# Patient Record
Sex: Female | Born: 1985 | Race: White | Hispanic: No | Smoking: Never smoker
Health system: Southern US, Community
[De-identification: ages and names within clinical notes are randomized; demographics above are authoritative.]

## PROBLEM LIST (undated history)

## (undated) DIAGNOSIS — Z98891 History of uterine scar from previous surgery: Secondary | ICD-10-CM

## (undated) DIAGNOSIS — K831 Obstruction of bile duct: Secondary | ICD-10-CM

## (undated) DIAGNOSIS — J40 Bronchitis, not specified as acute or chronic: Secondary | ICD-10-CM

## (undated) DIAGNOSIS — IMO0002 Reserved for concepts with insufficient information to code with codable children: Secondary | ICD-10-CM

## (undated) DIAGNOSIS — M797 Fibromyalgia: Secondary | ICD-10-CM

## (undated) HISTORY — PX: WISDOM TOOTH EXTRACTION: SHX21

## (undated) HISTORY — PX: KNEE SURGERY: SHX244

## (undated) SURGERY — Surgical Case
Anesthesia: *Unknown

---

## 2007-04-06 ENCOUNTER — Encounter: Admission: RE | Admit: 2007-04-06 | Discharge: 2007-04-06 | Payer: Self-pay | Admitting: Family Medicine

## 2008-01-30 IMAGING — US US SOFT TISSUE HEAD/NECK
1 series · 14 of 25 positions shown · non-contrast
Comparison: None.

CLINICAL DATA: Enlarged thyroid gland on physical examination.

THYROID ULTRASOUND
TECHNIQUE: Ultrasound examination of the thyroid gland and adjacent soft tissue
structures was performed.

[Series 1: unknown · 0.07mm/px · 14 of 30 slices shown]
[im 1/30]
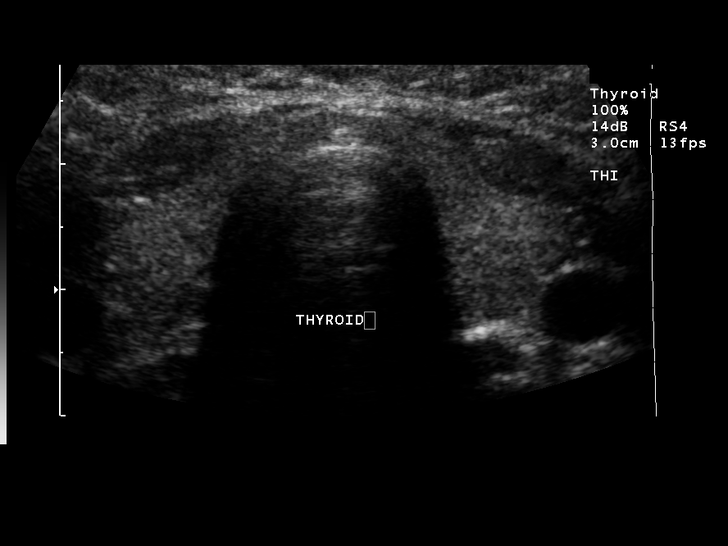
[im 3/30]
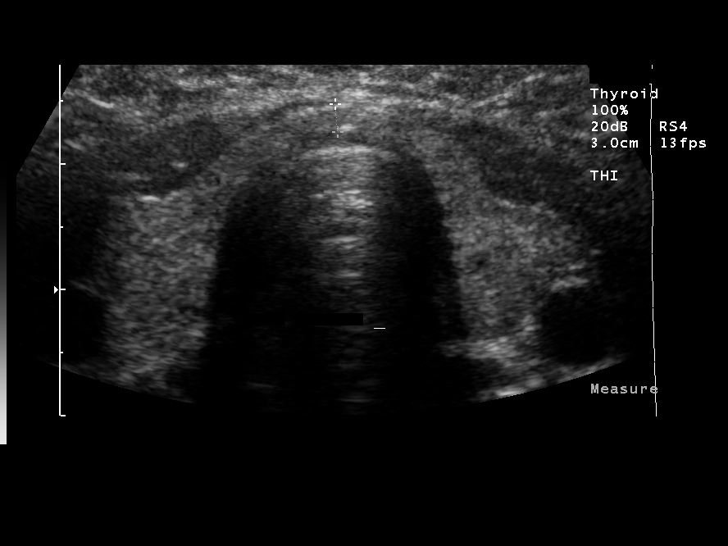
[im 5/30]
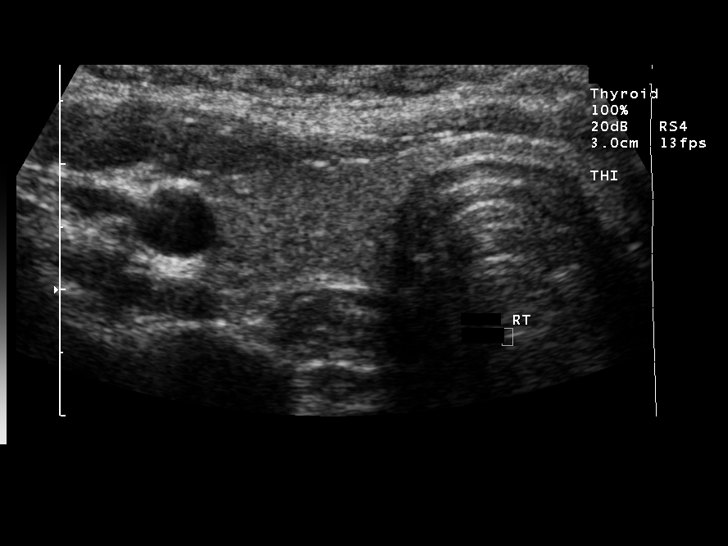
[im 8/30]
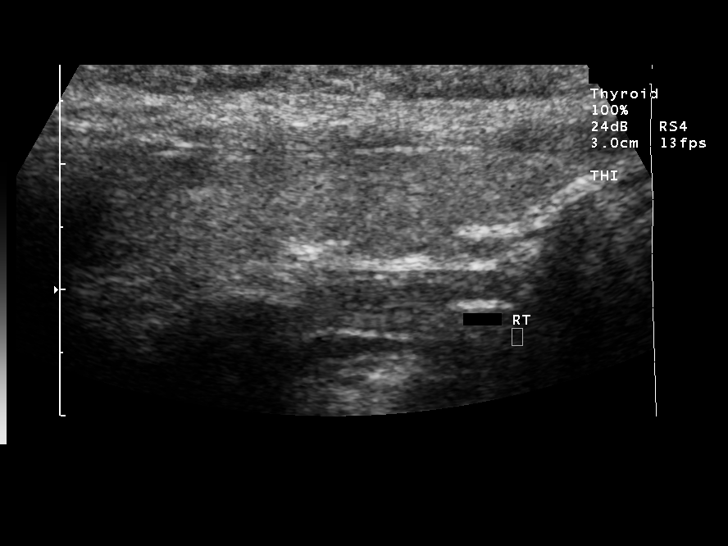
[im 10/30]
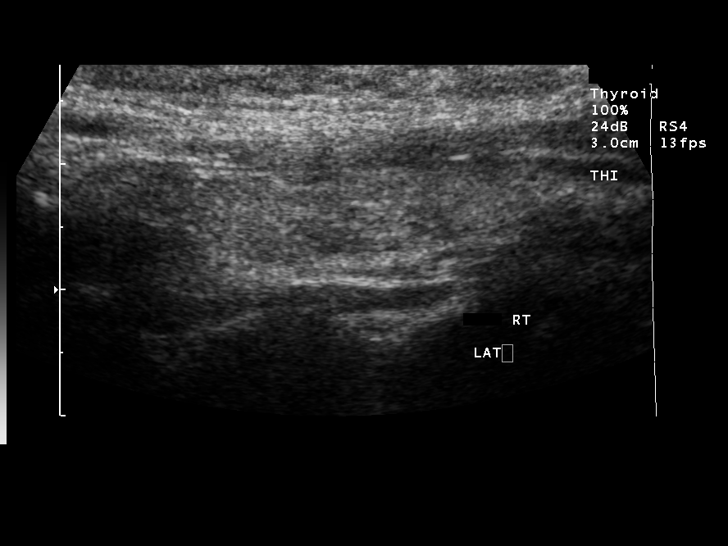
[im 11/30]
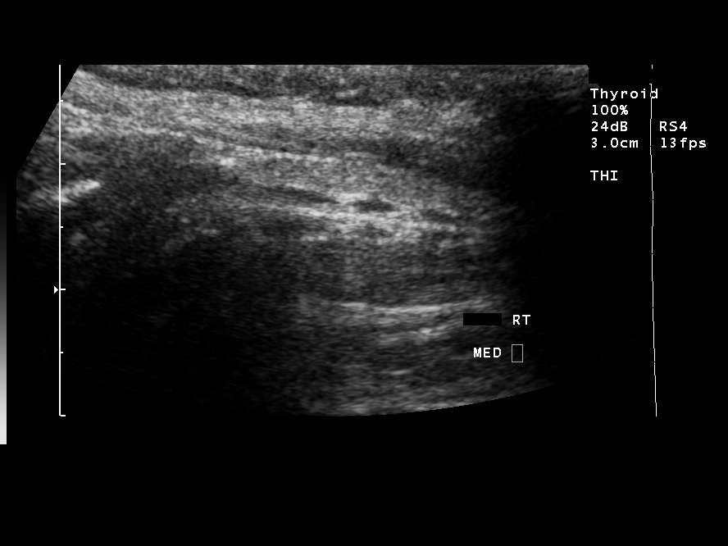
[im 14/30]
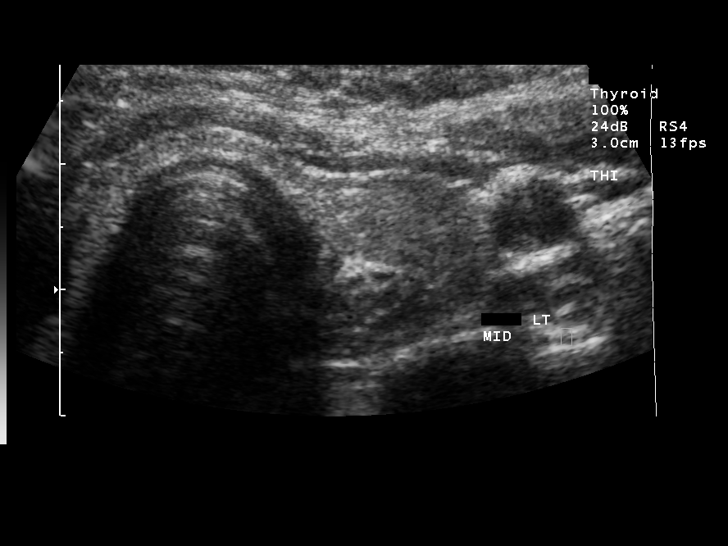
[im 16/30]
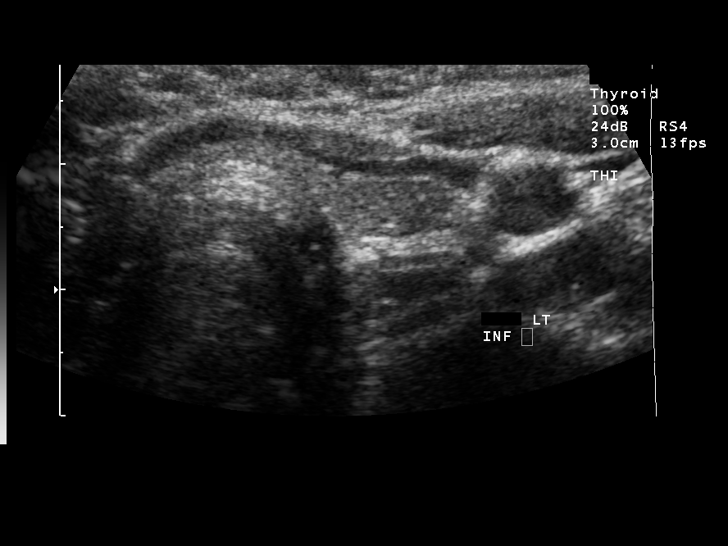
[im 19/30]
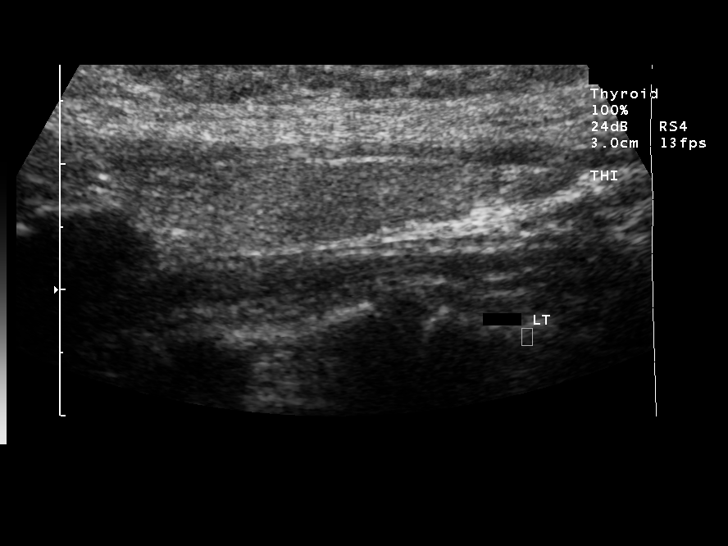
[im 20/30]
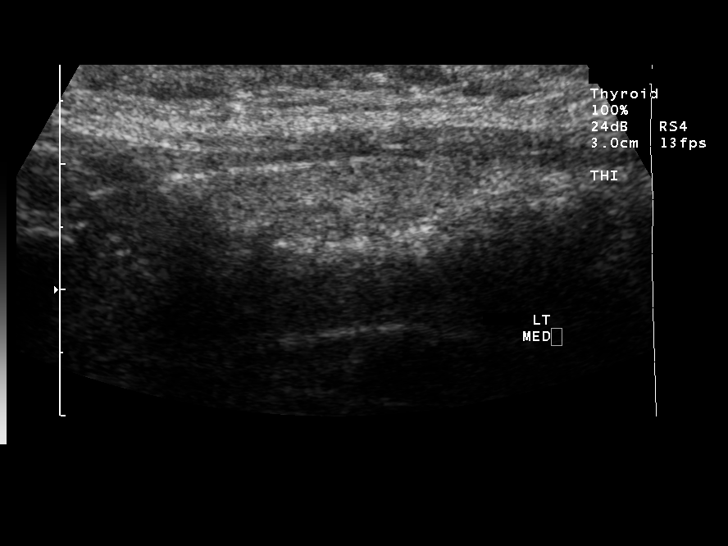
[im 22/30]
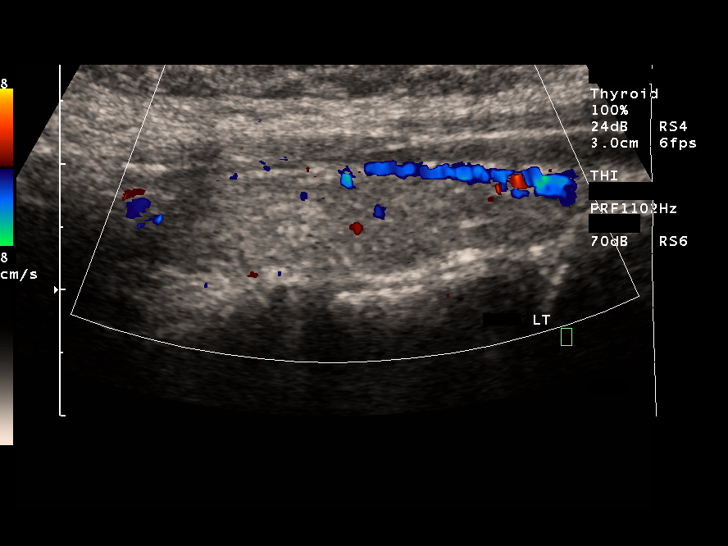
[im 25/30]
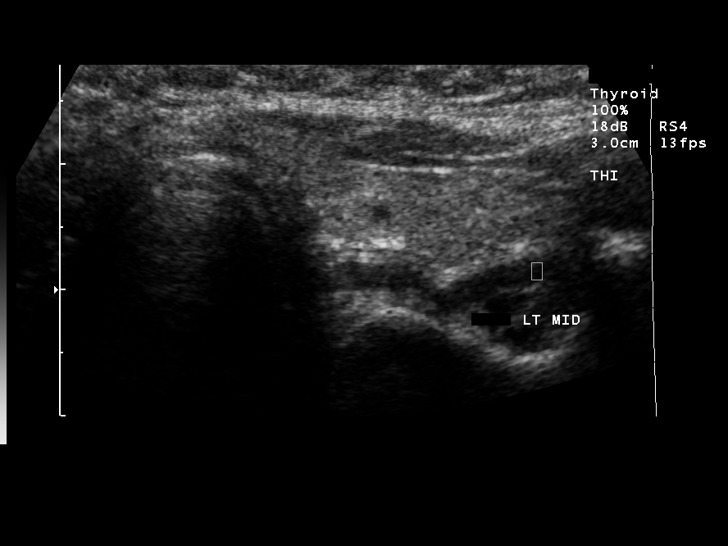
[im 27/30]
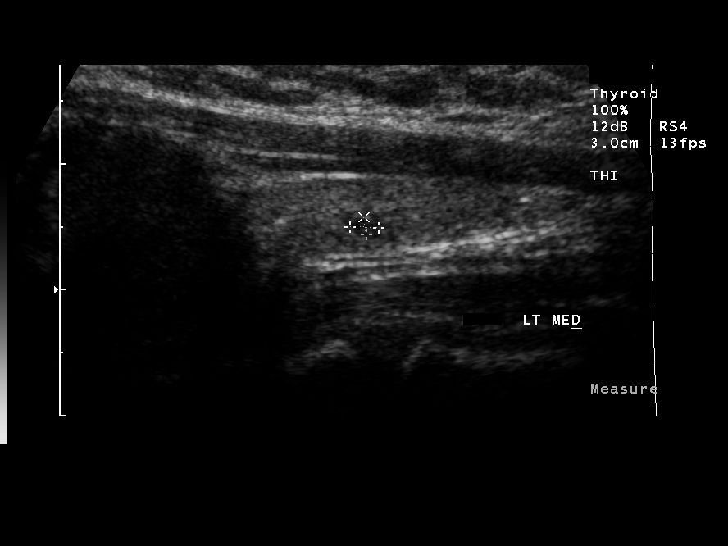
[im 30/30]
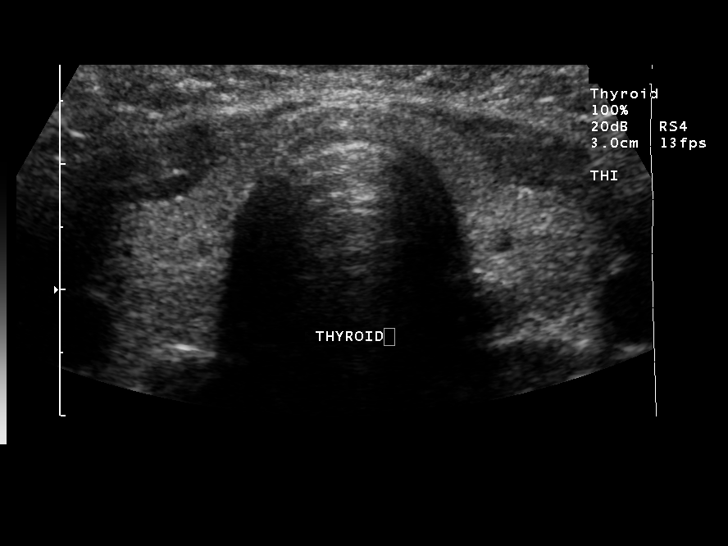

[14 of 25 positions shown; findings below may reference images not displayed]

FINDINGS: The thyroid gland is normal in size, shape and echotexture. A tube 2 x
1 mm cyst is noted in the medial aspect of the midportion of the left lobe. No
other abnormalities are seen.

The right lobe measures 4.3 x 1.8 x 0.9 cm in maximum dimensions. The left lobe
measures 3.6 x 1.6 x 0.6 cm in maximum dimensions. The isthmus measures 2.2 mm
in thickness in the midline.

IMPRESSION

2 mm left lobe and thyroid cyst. Otherwise, normal examination.

## 2011-08-23 ENCOUNTER — Emergency Department (HOSPITAL_COMMUNITY)
Admission: EM | Admit: 2011-08-23 | Discharge: 2011-08-23 | Disposition: A | Discharge status: Home / Self Care | Payer: 59 | Source: Home / Self Care | Attending: Emergency Medicine | Admitting: Emergency Medicine

## 2011-08-23 ENCOUNTER — Encounter: Payer: Self-pay | Admitting: *Deleted

## 2011-08-23 DIAGNOSIS — J40 Bronchitis, not specified as acute or chronic: Secondary | ICD-10-CM

## 2011-08-23 HISTORY — DX: Bronchitis, not specified as acute or chronic: J40

## 2011-08-23 MED ORDER — ALBUTEROL SULFATE HFA 108 (90 BASE) MCG/ACT IN AERS: 1.0000 | INHALATION_SPRAY | Freq: Four times a day (QID) | RESPIRATORY_TRACT | Status: DC | PRN

## 2011-08-23 MED ORDER — PSEUDOEPHEDRINE-GUAIFENESIN ER 120-1200 MG PO TB12: 1.0000 | ORAL_TABLET | Freq: Two times a day (BID) | ORAL | Status: DC

## 2011-08-23 MED ORDER — FLUTICASONE PROPIONATE 50 MCG/ACT NA SUSP: 2.0000 | Freq: Every day | NASAL | Status: DC

## 2011-08-23 MED ORDER — HYDROCODONE-HOMATROPINE 5-1.5 MG/5ML PO SYRP: 5.0000 mL | ORAL_SOLUTION | Freq: Four times a day (QID) | ORAL | Status: AC | PRN

## 2011-08-23 MED ORDER — IBUPROFEN 600 MG PO TABS: 600.0000 mg | ORAL_TABLET | Freq: Four times a day (QID) | ORAL | Status: AC | PRN

## 2011-08-23 MED ORDER — DEXAMETHASONE 4 MG PO TABS: ORAL_TABLET | ORAL | Status: AC

## 2011-08-23 NOTE — ED Notes (Addendum)
C/O starting w/ nasal drainage, sore throat, and HA 1 wk ago; then, started with dry cough.  Last night, started w/ productive cough.  Denies fevers.  Has tried Tyl, OTC cough med and Mucinex without relief.  Continues w/ sore throat and HA.  C/O tight feeling in chest.  Has not use albuterol inhaler (ran out).

## 2011-08-23 NOTE — ED Provider Notes (Signed)
History     CSN: [id number] Arrival date & time: 08/23/2011  9:36 AM   First MD Initiated Contact with Patient 08/23/11 [phone]      Chief Complaint  Patient presents with  . Cough  . Nasal Congestion  . Sore Throat    (Consider location/radiation/quality/duration/timing/severity/associated sxs/prior treatment) HPI Comments: Pt with cough intitally nonproductive now productive of yellowish sputum x 1 week. Also with rhinorrhea, postnasal drip, ST, fatigue. Reports SOB,  achy CP after coughing. No fevers. Wheezing, unable to sleep at night secondary to coughing. No ear pain, abd pain, rash, N/V. States this feels to previous episodes of bronchitis which she gets "around this time every year' and fo rwhich she takes albuterol. Does not have inhaler at home.    Patient is a 25 y.o. female presenting with cough and pharyngitis. The history is provided by the patient.  Cough This is a new problem. The current episode started more than 1 week ago. The problem has been gradually worsening. The cough is productive of sputum. There has been no fever. Associated symptoms include rhinorrhea, shortness of breath and wheezing. Pertinent negatives include no chest pain, no chills, no sweats, no headaches, no sore throat and no myalgias. She has tried cough syrup for the symptoms. Her past medical history is significant for bronchitis.  Sore Throat Associated symptoms include shortness of breath. Pertinent negatives include no chest pain and no headaches.    Past Medical History  Diagnosis Date  . Asthma     Allergy-induced  . Bronchitis     History reviewed. No pertinent past surgical history.  History reviewed. No pertinent family history.  History  Substance Use Topics  . Smoking status: Never Smoker   . Smokeless tobacco: Not on file  . Alcohol Use: No    OB History    Grav Para Term Preterm Abortions TAB SAB Ect Mult Living                  Review of Systems  Constitutional:  Negative for fever and chills.  HENT: Positive for congestion, rhinorrhea and postnasal drip. Negative for sore throat.   Respiratory: Positive for cough, shortness of breath and wheezing.   Cardiovascular: Negative for chest pain.  Gastrointestinal: Negative for nausea and vomiting.  Musculoskeletal: Negative for myalgias.  Skin: Negative for rash.  Neurological: Negative for headaches.    Allergies  Review of patient's allergies indicates no known allergies.  Home Medications   Current Outpatient Rx  Name Route Sig Dispense Refill  . ACETAMINOPHEN 500 MG PO TABS Oral Take 500 mg by mouth every 6 (six) hours as needed.      . ALBUTEROL SULFATE HFA 108 (90 BASE) MCG/ACT IN AERS Inhalation Inhale 1-2 puffs into the lungs every 6 (six) hours as needed for wheezing. 1 Inhaler 0  . DEXAMETHASONE 4 MG PO TABS  4 tabs po at once on day one, 4 tabs po at once on day 2 8 tablet 0  . FLUTICASONE PROPIONATE 50 MCG/ACT NA SUSP Nasal Place 2 sprays into the nose daily. 16 g 2  . HYDROCODONE-HOMATROPINE 5-1.5 MG/5ML PO SYRP Oral Take 5 mLs by mouth every 6 (six) hours as needed for cough or pain. 120 mL 0  . IBUPROFEN 600 MG PO TABS Oral Take 1 tablet (600 mg total) by mouth every 6 (six) hours as needed for pain. 30 tablet 0  . PSEUDOEPHEDRINE-GUAIFENESIN [phone] MG PO TB12 Oral Take 1 tablet by mouth 2 (two) times  daily. 20 each 0    BP 118/66  Pulse 76  Temp(Src) 98.7 F (37.1 C) (Oral)  Resp 18  SpO2 100%  LMP 08/19/2011  Physical Exam  Nursing note and vitals reviewed. Constitutional: She is oriented to person, place, and time. She appears well-developed and well-nourished.  HENT:  Head: Normocephalic and atraumatic.  Right Ear: Hearing and ear canal normal. Tympanic membrane is retracted.  Left Ear: Tympanic membrane and ear canal normal.  Nose: Mucosal edema and rhinorrhea present. No epistaxis.  Mouth/Throat: Uvula is midline and mucous membranes are normal. Posterior  oropharyngeal erythema present. No oropharyngeal exudate.       Serous effusion R TM. (-) frontal, maxillary sinus tenderness  Eyes: Conjunctivae and EOM are normal. Pupils are equal, round, and reactive to light.  Neck: Normal range of motion. Neck supple.  Cardiovascular: Normal rate, regular rhythm and normal heart sounds.   Pulmonary/Chest: Effort normal and breath sounds normal. No respiratory distress. She has no wheezes. She has no rales.       Diffuse chest wall tenderness  Abdominal: Soft. She exhibits no distension. There is no tenderness. There is no rebound and no guarding.  Musculoskeletal: Normal range of motion.  Lymphadenopathy:    She has no cervical adenopathy.  Neurological: She is alert and oriented to person, place, and time.  Skin: Skin is warm and dry. No rash noted.  Psychiatric: She has a normal mood and affect. Her behavior is normal. Judgment and thought content normal.    ED Course  Procedures (including critical care time)   Labs Reviewed  POCT RAPID STREP A ([facility] CARE ONLY)   No results found.   1. Bronchitis       MDM    Luiz Blare, MD 08/23/11 [phone]

## 2015-11-25 ENCOUNTER — Other Ambulatory Visit (HOSPITAL_COMMUNITY): Payer: Self-pay | Admitting: Obstetrics and Gynecology

## 2015-11-25 DIAGNOSIS — Z369 Encounter for antenatal screening, unspecified: Secondary | ICD-10-CM

## 2015-12-10 ENCOUNTER — Other Ambulatory Visit (HOSPITAL_COMMUNITY): Payer: Self-pay | Admitting: Obstetrics and Gynecology

## 2015-12-10 ENCOUNTER — Ambulatory Visit (HOSPITAL_COMMUNITY): Payer: Managed Care, Other (non HMO)

## 2015-12-10 ENCOUNTER — Ambulatory Visit (HOSPITAL_COMMUNITY)
Admission: RE | Admit: 2015-12-10 | Discharge: 2015-12-10 | Disposition: A | Discharge status: Home / Self Care | Payer: Managed Care, Other (non HMO) | Source: Ambulatory Visit | Attending: Obstetrics and Gynecology | Admitting: Obstetrics and Gynecology

## 2015-12-10 ENCOUNTER — Encounter (HOSPITAL_COMMUNITY): Payer: Self-pay

## 2015-12-10 ENCOUNTER — Ambulatory Visit (HOSPITAL_COMMUNITY): Admission: RE | Admit: 2015-12-10 | Payer: Managed Care, Other (non HMO) | Source: Ambulatory Visit

## 2015-12-10 ENCOUNTER — Other Ambulatory Visit (HOSPITAL_COMMUNITY): Payer: Self-pay | Admitting: *Deleted

## 2015-12-10 DIAGNOSIS — IMO0002 Reserved for concepts with insufficient information to code with codable children: Secondary | ICD-10-CM

## 2015-12-10 DIAGNOSIS — Z3A26 26 weeks gestation of pregnancy: Secondary | ICD-10-CM | POA: Diagnosis not present

## 2015-12-10 DIAGNOSIS — O359XX0 Maternal care for (suspected) fetal abnormality and damage, unspecified, not applicable or unspecified: Secondary | ICD-10-CM

## 2015-12-10 DIAGNOSIS — Z315 Encounter for genetic counseling: Secondary | ICD-10-CM | POA: Insufficient documentation

## 2015-12-10 DIAGNOSIS — O35HXX0 Maternal care for other (suspected) fetal abnormality and damage, fetal lower extremities anomalies, not applicable or unspecified: Secondary | ICD-10-CM

## 2015-12-10 DIAGNOSIS — Z36 Encounter for antenatal screening of mother: Secondary | ICD-10-CM | POA: Insufficient documentation

## 2015-12-10 DIAGNOSIS — O358XX0 Maternal care for other (suspected) fetal abnormality and damage, not applicable or unspecified: Secondary | ICD-10-CM

## 2015-12-10 DIAGNOSIS — Z8279 Family history of other congenital malformations, deformations and chromosomal abnormalities: Secondary | ICD-10-CM | POA: Insufficient documentation

## 2015-12-10 DIAGNOSIS — Z369 Encounter for antenatal screening, unspecified: Secondary | ICD-10-CM

## 2015-12-10 DIAGNOSIS — Z1389 Encounter for screening for other disorder: Secondary | ICD-10-CM

## 2015-12-10 NOTE — Progress Notes (Signed)
Genetic Counseling  High-Risk Gestation Note  Appointment Date:  12/10/2015 Referred By: Sherian [name], Jennifer, * Date of Birth:  February 12, 1986   Pregnancy History: G1P0 Estimated Date of Delivery: 03/14/16 Estimated Gestational Age: 3026w3d Attending: Damaris HippoJeffrey Denney, MD   Ms. Jennifer CooleyAmanda R [name] was seen for genetic counseling because ultrasound findings.    In Summary:  Ultrasound today visualized short fetal long bones  Reviewed likely normal variation but also increased chance for underlying chromosome condition (such as Down syndrome), skeletal dysplasia, or fetal growth restriction  Patient declined NIPS and amniocentesis  Follow-up ultrasound scheduled for 01/07/16  Family history significant for cleft lip and palate in paternal uncle and for autism for the patient's maternal half-sister  Detailed ultrasound was performed today. Ultrasound visualized fetal long bones that are short for gestational age. Bones appeared to have normal ossification with no evidence of bowing. Remaining visualized fetal anatomy appeared normal. Complete ultrasound results reported separately.    We discussed that the second trimester genetic sonogram is targeted at identifying features associated with aneuploidy.  It has evolved as a screening tool used to provide an individualized risk assessment for Down syndrome and other trisomies.  The ability of sonography to aid in the detection of aneuploidies relies on identification of both major structural anomalies and "soft markers."  The patient was counseled that the latter term refers to findings that are often normal variants and do not cause any significant medical problems.  Nonetheless, these markers have a known association with aneuploidy.    The humerus and femur are typically referred to as the "long bones." A shortened measurement of the longs bones is typically defined as measuring less than the 5%tile for gestational age. This occurs in an estimated  6-7% of pregnancies and typically represent a variation of normal. However, short long bones on prenatal ultrasound have been described to be associated with fetal aneuploidy, underlying genetic conditions, and fetal growth restriction. Regarding an association with fetal aneuploidy, shortened femur length on prenatal ultrasound has been reported to have a weaker association with aneuploidy compared to shortened humeri.   Based on the combination of ultrasound findings, and considering Jennifer [name]'s normal Quad screen result (1 in 2005 Down syndrome risk and screen negative Trisomy 3318) and her age related risk for fetal aneuploidy, the adjusted risk for fetal aneuploidy is estimated to be increased but still likely less than her a priori risk (based on age) for fetal aneuploidy. We reviewed chromosomes, nondisjunction, and the common features of Down syndrome. We also reviewed other aneuploidies including trisomies 13 and 4118; however, we discussed that these conditions are less likely given that the remainder of the fetal anatomy was wnl by ultrasound.  We reviewed other available screening and diagnostic options including noninvasive prenatal screening (NIPS)/cell free DNA (cfDNA) testing, and amniocentesis.  She was counseled regarding the benefits and limitations of each option.  We reviewed the approximate 1 in 300-500 risk for complications for amniocentesis, including spontaneous pregnancy loss. However, currently the risk for fetal aneuploidy is estimated to less than the associated risk of complications from amniocentesis. After consideration of all the options, she declined NIPS and amniocentesis.     We then discussed other possible explanations for the above discussed ultrasound findings including single gene conditions.  Single gene conditions are typically tested for postnatally, based on the recommendation of a medical geneticist, unless ultrasound findings or the family history are strongly  suggestive of a specific syndrome. Specifically, we discussed that short long bones can  be associated with skeletal dysplasias. However on the ultrasound today, the long bones appear to have normal morphology (no fractures, abnormal curvature or abnormal calcification). We discussed that the ultrasound findings are not suggestive of a particular skeletal dysplasia. They were also counseled that shortened long bones can be a feature in a variety of other genetic conditions. Additionally, short long bones can also be associated with poor fetal growth. However, we reviewed that the majority of cases represent a normal variant of growth or the result of familial or constitutional short stature. Jennifer [name] reported that she is 5'5", and her husband is 5'10".  We discussed that the prognosis and postnatal management depend on the underlying etiology of the shortened bones.  Follow-up ultrasound was planned for 01/07/16.     Both family histories were reviewed and found to be contributory for autism and delayed motor skills for the patient's maternal half-sister. She reported no known etiology and was unsure if her sister has had a genetics evaluation to assess for possible underlying etiologies. We discussed that autism is part of the spectrum of conditions referred to as Autistic spectrum disorders (ASD). We discussed that ASDs are among the most common neurodevelopmental disorders, with approximately 1 in 68 children meeting criteria for ASD, according to the Centers for Disease Control. Approximately 80% of individuals diagnosed are female. There is strong evidence that genetic factors play a critical role in development of ASD. There have been recent advances in identifying specific genetic causes of ASD, however, there are still many individuals for whom the etiology of the ASD is not known. The majority of individuals with ASD (70-80%) have essential autism. There is strong evidence that genetic factors play a  critical role in development of ASD. Some individuals with ASDs are found to have causative differences in karyotype analysis, chromosomal microarray analysis, or single genes. These are more likely to be identified in individuals with complex autism spectrum disorders.     Once a couple has a child with a diagnosis of ASD, there is a 13.5% chance to have another child with ASD. There is limited data regarding recurrence risk estimate for third degree relatives of individuals with autism in the case of an unknown etiology. They understand that at this time there is not genetic testing available for ASD for most families.    Additionally, Ms. Westendorf reported a paternal uncle with cleft lip and palate. He has had surgical repairs and is otherwise healthy. No additional relatives were reported with cleft lip or palate including this uncle's children. We discussed that cleft lip +/- cleft palate occurs in approximately 1 in 1000 births and can be syndromic or isolated.  If the patient's relative has a syndromic form of clefting, the chance of having an affected child depends on the inheritance pattern of that condition.  If the patient's relative has an isolated form of clefting, we discussed the probable multifactorial inheritance and explained that genetic testing for isolated cleft lip +/- cleft palate is not currently available.  Based on the family history, this couple's chance to have a baby with an isolated cleft lip +/- cleft palate is ~0.3%. We reviewed the benefits and limitations of detailed ultrasound to assess for orofacial clefting. The patient understands that ultrasound cannot diagnose or rule out all birth defects or genetic conditions prenatally.   The patient also reported that her mother had a stillborn son (a half-sibling to the patient) who had a two vessel umbilical cord. However, additional information regarding a  specific underlying cause not known at this time. Without further  information regarding the provided family history, an accurate genetic risk cannot be calculated. Further genetic counseling is warranted if more information is obtained.  Ms. ZYAIRA VEJAR denied exposure to environmental toxins or chemical agents. She denied the use of alcohol, tobacco or street drugs. She denied significant viral illnesses during the course of her pregnancy. Her medical and surgical histories were noncontributory.   I counseled Jennifer [name] regarding the above risks and available options. Most of the counseling was provided by Oda Kilts, [employer] genetic counseling student, under my direct supervision. The approximate face-to-face time with the genetic counselor was 25 minutes.     Quinn Plowman, MS Certified Genetic Counselor 12/10/2015

## 2015-12-18 ENCOUNTER — Other Ambulatory Visit (HOSPITAL_COMMUNITY): Payer: Self-pay

## 2016-01-07 ENCOUNTER — Ambulatory Visit (HOSPITAL_COMMUNITY): Payer: Managed Care, Other (non HMO)

## 2016-01-12 ENCOUNTER — Encounter (HOSPITAL_COMMUNITY): Payer: Self-pay

## 2016-01-12 ENCOUNTER — Inpatient Hospital Stay (HOSPITAL_COMMUNITY)
Admission: AD | Admit: 2016-01-12 | Discharge: 2016-01-12 | Disposition: A | Discharge status: Home / Self Care | Payer: Managed Care, Other (non HMO) | Source: Ambulatory Visit | Attending: Obstetrics and Gynecology | Admitting: Obstetrics and Gynecology

## 2016-01-12 DIAGNOSIS — O26893 Other specified pregnancy related conditions, third trimester: Secondary | ICD-10-CM | POA: Insufficient documentation

## 2016-01-12 DIAGNOSIS — O99613 Diseases of the digestive system complicating pregnancy, third trimester: Secondary | ICD-10-CM | POA: Diagnosis not present

## 2016-01-12 DIAGNOSIS — Z3A31 31 weeks gestation of pregnancy: Secondary | ICD-10-CM | POA: Insufficient documentation

## 2016-01-12 DIAGNOSIS — A084 Viral intestinal infection, unspecified: Secondary | ICD-10-CM | POA: Diagnosis not present

## 2016-01-12 LAB — URINE MICROSCOPIC-ADD ON: RBC / HPF: NONE SEEN RBC/hpf (ref 0–5)

## 2016-01-12 LAB — URINALYSIS, ROUTINE W REFLEX MICROSCOPIC
BILIRUBIN URINE: NEGATIVE
GLUCOSE, UA: NEGATIVE mg/dL
HGB URINE DIPSTICK: NEGATIVE
Ketones, ur: 15 mg/dL — AB
Nitrite: NEGATIVE
PH: 6 (ref 5.0–8.0)
Protein, ur: NEGATIVE mg/dL
SPECIFIC GRAVITY, URINE: 1.02 (ref 1.005–1.030)

## 2016-01-12 MED ORDER — PROMETHAZINE HCL 25 MG/ML IJ SOLN
25.0000 mg | Freq: Once | INTRAVENOUS | Status: AC
  Administered 2016-01-12: 25 mg via INTRAVENOUS
  Filled 2016-01-12: qty 1

## 2016-01-12 MED ORDER — ONDANSETRON 4 MG PO TBDP: 4.0000 mg | ORAL_TABLET | Freq: Four times a day (QID) | ORAL | Status: DC | PRN

## 2016-01-12 NOTE — Discharge Instructions (Signed)

## 2016-01-12 NOTE — MAU Note (Signed)
Patient presents with vomiting since this morning.

## 2016-01-12 NOTE — MAU Provider Note (Signed)
History     CSN: [id number]  Arrival date & time 01/12/16  1025   First Provider Initiated Contact with Patient 01/12/16 1052      Chief Complaint  Patient presents with  . Emesis During Pregnancy   Taking Zantac and Protonix.  HPI Comments: Jennifer Cooleymanda R [name] is a 30 y.o. G1P0 at 9261w1d presenting with nausea and vomiting of 1 day duration. States she vomited 2 or 3 times today and has retained no food and minimal fluids today. Still nauseated. 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 Good FM.    Emesis  This is a new problem. The current episode started today. The problem occurs 2 to 4 times per day. The problem has been unchanged. The emesis has an appearance of bile and stomach contents. There has been no fever. Associated symptoms include abdominal pain. Pertinent negatives include no chills, diarrhea, dizziness or fever. Risk factors include ill contacts. She has tried nothing for the symptoms.   Pregnancy course essentially uncomplicated.   Past Medical History  Diagnosis Date  . Asthma     Allergy-induced  . Bronchitis     History reviewed. No pertinent past surgical history.  History reviewed. No pertinent family history.  Social History  Substance Use Topics  . Smoking status: Never Smoker   . Smokeless tobacco: None  . Alcohol Use: No    OB History    Gravida Para Term Preterm AB TAB SAB Ectopic Multiple Living   1               Review of Systems  Constitutional: Negative for fever and chills.  Gastrointestinal: Positive for vomiting and abdominal pain. Negative for diarrhea.  Neurological: Negative for dizziness.    Allergies  Review of patient's allergies indicates no known allergies.  Home Medications  No current outpatient prescriptions on file.  BP 111/56 mmHg  Pulse 89  Temp(Src) 98.3 F (36.8 C) (Oral)  Resp 18  Ht 5\' 5"  (1.651 m)  Wt 97.995 kg (216 lb 0.6 oz)  BMI 35.95 kg/m2  SpO2 99%  LMP 06/03/2015 (Approximate) Filed  Vitals:   01/12/16 1033 01/12/16 1039 01/12/16 1223 01/12/16 1241  BP:  127/88  111/56  Pulse:  116 90 89  Temp:  98.3 F (36.8 C)    TempSrc:  Oral    Resp:  18  18  Height: 5\' 5"  (1.651 m)     Weight: 97.995 kg (216 lb 0.6 oz)     SpO2:   99%    Physical Exam  Constitutional: She appears well-developed and well-nourished. No distress.  Looks fatigued  HENT:  Head: Normocephalic.  Neck: Normal range of motion.  Pulmonary/Chest: Effort normal. No respiratory distress.  Abdominal: There is no tenderness. There is no guarding.  Size consistent with dates  Musculoskeletal: Normal range of motion.  Nursing note and vitals reviewed.  EFM: Baseline 135, reactive. Isolated variable to 70bpm x 30 sec, then baseline 150. Moderate variability. Reactive. No UCs MAU Course  Procedures (including critical care time)  Labs Reviewed  URINALYSIS, ROUTINE W REFLEX MICROSCOPIC (NOT AT [facility]) - Abnormal; Notable for the following:    APPearance HAZY (*)    Ketones, ur 15 (*)    Leukocytes, UA TRACE (*)    All other components within normal limits  URINE MICROSCOPIC-ADD ON - Abnormal; Notable for the following:    Squamous Epithelial / LPF 6-30 (*)    Bacteria, UA FEW (*)    All  other components within normal limits   11:15: LR 1000 with Phenergan  > 12:45 feels better and tolerating po Consulted Dr. Ellyn Hack    ASSESSMENT: G1 at [redacted]w[redacted]d Fetal well being by [facility] Viral gastroenteritis - Plan: Discharge patient    PLAN: Discharge home with precautions. Gradually advance diet    Medication List    STOP taking these medications        albuterol 108 (90 Base) MCG/ACT inhaler  Commonly known as:  PROVENTIL HFA;VENTOLIN HFA     fluticasone 50 MCG/ACT nasal spray  Commonly known as:  FLONASE     Pseudoephedrine-Guaifenesin [phone] MG Tb12  Commonly known as:  MUCINEX D      TAKE these medications        cetirizine 10 MG tablet  Commonly known as:  ZYRTEC  Take 10 mg by  mouth daily.     ondansetron 4 MG disintegrating tablet  Commonly known as:  ZOFRAN ODT  Take 1 tablet (4 mg total) by mouth every 6 (six) hours as needed for nausea.     pantoprazole 40 MG tablet  Commonly known as:  PROTONIX  Take 40 mg by mouth daily.     prenatal multivitamin Tabs tablet  Take 1 tablet by mouth daily at 12 noon.     ranitidine 150 MG tablet  Commonly known as:  ZANTAC  Take 150 mg by mouth 2 (two) times daily.       Follow-up Information    Follow up with Sherian Rein, MD.   Specialty:  Obstetrics and Gynecology   Why:  Keep your scheduled prenatal appointment   Contact information:   [address] [place] [address] [phone]

## 2016-02-04 ENCOUNTER — Ambulatory Visit (HOSPITAL_COMMUNITY): Payer: Managed Care, Other (non HMO)

## 2016-02-17 ENCOUNTER — Inpatient Hospital Stay (HOSPITAL_COMMUNITY): Payer: Managed Care, Other (non HMO) | Admitting: Anesthesiology

## 2016-02-17 ENCOUNTER — Inpatient Hospital Stay (HOSPITAL_COMMUNITY)
Admission: AD | Admit: 2016-02-17 | Discharge: 2016-02-20 | DRG: 765 | Disposition: A | Discharge status: Home / Self Care | Payer: Managed Care, Other (non HMO) | Source: Ambulatory Visit | Attending: Obstetrics and Gynecology | Admitting: Obstetrics and Gynecology

## 2016-02-17 ENCOUNTER — Encounter (HOSPITAL_COMMUNITY): Payer: Self-pay

## 2016-02-17 ENCOUNTER — Encounter (HOSPITAL_COMMUNITY)
Admission: AD | Disposition: A | Discharge status: Home / Self Care | Payer: Self-pay | Source: Ambulatory Visit | Attending: Obstetrics and Gynecology

## 2016-02-17 DIAGNOSIS — O133 Gestational [pregnancy-induced] hypertension without significant proteinuria, third trimester: Secondary | ICD-10-CM | POA: Diagnosis not present

## 2016-02-17 DIAGNOSIS — IMO0002 Reserved for concepts with insufficient information to code with codable children: Secondary | ICD-10-CM | POA: Diagnosis present

## 2016-02-17 DIAGNOSIS — O163 Unspecified maternal hypertension, third trimester: Secondary | ICD-10-CM

## 2016-02-17 DIAGNOSIS — O9952 Diseases of the respiratory system complicating childbirth: Secondary | ICD-10-CM | POA: Diagnosis present

## 2016-02-17 DIAGNOSIS — Z3A36 36 weeks gestation of pregnancy: Secondary | ICD-10-CM

## 2016-02-17 DIAGNOSIS — J45909 Unspecified asthma, uncomplicated: Secondary | ICD-10-CM | POA: Diagnosis present

## 2016-02-17 DIAGNOSIS — O4103X0 Oligohydramnios, third trimester, not applicable or unspecified: Secondary | ICD-10-CM | POA: Diagnosis present

## 2016-02-17 DIAGNOSIS — Z98891 History of uterine scar from previous surgery: Secondary | ICD-10-CM

## 2016-02-17 DIAGNOSIS — Z8249 Family history of ischemic heart disease and other diseases of the circulatory system: Secondary | ICD-10-CM | POA: Diagnosis not present

## 2016-02-17 DIAGNOSIS — Z833 Family history of diabetes mellitus: Secondary | ICD-10-CM

## 2016-02-17 DIAGNOSIS — O365930 Maternal care for other known or suspected poor fetal growth, third trimester, not applicable or unspecified: Secondary | ICD-10-CM | POA: Diagnosis present

## 2016-02-17 DIAGNOSIS — Z808 Family history of malignant neoplasm of other organs or systems: Secondary | ICD-10-CM

## 2016-02-17 HISTORY — DX: History of uterine scar from previous surgery: Z98.891

## 2016-02-17 HISTORY — DX: Reserved for concepts with insufficient information to code with codable children: IMO0002

## 2016-02-17 HISTORY — DX: Obstruction of bile duct: K83.1

## 2016-02-17 HISTORY — DX: Fibromyalgia: M79.7

## 2016-02-17 LAB — CBC
HCT: 40.4 % (ref 36.0–46.0)
Hemoglobin: 14.1 g/dL (ref 12.0–15.0)
MCH: 30.4 pg (ref 26.0–34.0)
MCHC: 34.9 g/dL (ref 30.0–36.0)
MCV: 87.1 fL (ref 78.0–100.0)
PLATELETS: 222 10*3/uL (ref 150–400)
RBC: 4.64 MIL/uL (ref 3.87–5.11)
RDW: 13.9 % (ref 11.5–15.5)
WBC: 12.4 10*3/uL — AB (ref 4.0–10.5)

## 2016-02-17 LAB — COMPREHENSIVE METABOLIC PANEL
ALBUMIN: 3.1 g/dL — AB (ref 3.5–5.0)
ALT: 17 U/L (ref 14–54)
AST: 18 U/L (ref 15–41)
Alkaline Phosphatase: 140 U/L — ABNORMAL HIGH (ref 38–126)
Anion gap: 7 (ref 5–15)
BUN: 11 mg/dL (ref 6–20)
CHLORIDE: 105 mmol/L (ref 101–111)
CO2: 22 mmol/L (ref 22–32)
CREATININE: 0.61 mg/dL (ref 0.44–1.00)
Calcium: 8.7 mg/dL — ABNORMAL LOW (ref 8.9–10.3)
GFR calc Af Amer: >60 mL/min (ref >60)
GFR calc non Af Amer: >60 mL/min (ref >60)
Glucose, Bld: 100 mg/dL — ABNORMAL HIGH (ref 65–99)
POTASSIUM: 3.8 mmol/L (ref 3.5–5.1)
SODIUM: 134 mmol/L — AB (ref 135–145)
Total Bilirubin: 0.6 mg/dL (ref 0.3–1.2)
Total Protein: 6 g/dL — ABNORMAL LOW (ref 6.5–8.1)

## 2016-02-17 LAB — URINALYSIS, ROUTINE W REFLEX MICROSCOPIC
BILIRUBIN URINE: NEGATIVE
Glucose, UA: NEGATIVE mg/dL
KETONES UR: NEGATIVE mg/dL
Leukocytes, UA: NEGATIVE
NITRITE: NEGATIVE
PROTEIN: NEGATIVE mg/dL
pH: 5.5 (ref 5.0–8.0)

## 2016-02-17 LAB — URINE MICROSCOPIC-ADD ON

## 2016-02-17 LAB — PROTEIN / CREATININE RATIO, URINE
Creatinine, Urine: 28 mg/dL
Total Protein, Urine: <6 mg/dL

## 2016-02-17 LAB — TYPE AND SCREEN
ABO/RH(D): O POS
ANTIBODY SCREEN: NEGATIVE

## 2016-02-17 LAB — ABO/RH: ABO/RH(D): O POS

## 2016-02-17 SURGERY — Surgical Case
Anesthesia: Spinal

## 2016-02-17 MED ORDER — LACTATED RINGERS IV SOLN: INTRAVENOUS | Status: DC

## 2016-02-17 MED ORDER — FENTANYL CITRATE (PF) 100 MCG/2ML IJ SOLN
INTRAMUSCULAR | Status: DC | PRN
  Administered 2016-02-17: 10 ug via INTRATHECAL

## 2016-02-17 MED ORDER — SCOPOLAMINE 1 MG/3DAYS TD PT72
MEDICATED_PATCH | TRANSDERMAL | Status: AC
  Filled 2016-02-17: qty 1

## 2016-02-17 MED ORDER — OXYTOCIN 10 UNIT/ML IJ SOLN
INTRAMUSCULAR | Status: AC
  Filled 2016-02-17: qty 4

## 2016-02-17 MED ORDER — DEXAMETHASONE SODIUM PHOSPHATE 4 MG/ML IJ SOLN
INTRAMUSCULAR | Status: DC | PRN
  Administered 2016-02-17: 4 mg via INTRAVENOUS

## 2016-02-17 MED ORDER — BETAMETHASONE SOD PHOS & ACET 6 (3-3) MG/ML IJ SUSP
12.0000 mg | Freq: Once | INTRAMUSCULAR | Status: AC
  Administered 2016-02-17: 12 mg via INTRAMUSCULAR
  Filled 2016-02-17: qty 2

## 2016-02-17 MED ORDER — LACTATED RINGERS IV SOLN
INTRAVENOUS | Status: DC
  Administered 2016-02-17 – 2016-02-18 (×4): via INTRAVENOUS

## 2016-02-17 MED ORDER — MEPERIDINE HCL 25 MG/ML IJ SOLN
INTRAMUSCULAR | Status: DC | PRN
  Administered 2016-02-17 (×2): 12.5 mg via INTRAVENOUS

## 2016-02-17 MED ORDER — FAMOTIDINE IN NACL 20-0.9 MG/50ML-% IV SOLN
20.0000 mg | Freq: Once | INTRAVENOUS | Status: AC
  Administered 2016-02-17: 20 mg via INTRAVENOUS
  Filled 2016-02-17: qty 50

## 2016-02-17 MED ORDER — ONDANSETRON HCL 4 MG/2ML IJ SOLN
INTRAMUSCULAR | Status: AC
  Filled 2016-02-17: qty 2

## 2016-02-17 MED ORDER — PHENYLEPHRINE 8 MG IN D5W 100 ML (0.08MG/ML) PREMIX OPTIME
INJECTION | INTRAVENOUS | Status: AC
  Filled 2016-02-17: qty 100

## 2016-02-17 MED ORDER — DEXAMETHASONE SODIUM PHOSPHATE 4 MG/ML IJ SOLN
INTRAMUSCULAR | Status: AC
  Filled 2016-02-17: qty 1

## 2016-02-17 MED ORDER — ONDANSETRON HCL 4 MG/2ML IJ SOLN
INTRAMUSCULAR | Status: DC | PRN
  Administered 2016-02-17: 4 mg via INTRAVENOUS

## 2016-02-17 MED ORDER — SOD CITRATE-CITRIC ACID 500-334 MG/5ML PO SOLN
30.0000 mL | Freq: Once | ORAL | Status: AC
  Administered 2016-02-17: 30 mL via ORAL
  Filled 2016-02-17: qty 15

## 2016-02-17 MED ORDER — MORPHINE SULFATE (PF) 0.5 MG/ML IJ SOLN
INTRAMUSCULAR | Status: AC
  Filled 2016-02-17: qty 10

## 2016-02-17 MED ORDER — FENTANYL CITRATE (PF) 100 MCG/2ML IJ SOLN
INTRAMUSCULAR | Status: AC
  Filled 2016-02-17: qty 2

## 2016-02-17 MED ORDER — LACTATED RINGERS IV BOLUS (SEPSIS): 1000.0000 mL | Freq: Once | INTRAVENOUS | Status: AC

## 2016-02-17 MED ORDER — ONDANSETRON HCL 4 MG/2ML IJ SOLN: INTRAMUSCULAR | Status: DC | PRN

## 2016-02-17 MED ORDER — SCOPOLAMINE 1 MG/3DAYS TD PT72
MEDICATED_PATCH | TRANSDERMAL | Status: DC | PRN
  Administered 2016-02-17: 1 via TRANSDERMAL

## 2016-02-17 MED ORDER — BUPIVACAINE IN DEXTROSE 0.75-8.25 % IT SOLN
INTRATHECAL | Status: DC | PRN
  Administered 2016-02-17: 1.6 mL via INTRATHECAL

## 2016-02-17 MED ORDER — OXYTOCIN 10 UNIT/ML IJ SOLN
40.0000 [IU] | INTRAVENOUS | Status: DC | PRN
  Administered 2016-02-17: 40 [IU] via INTRAVENOUS

## 2016-02-17 MED ORDER — PHENYLEPHRINE 8 MG IN D5W 100 ML (0.08MG/ML) PREMIX OPTIME
INJECTION | INTRAVENOUS | Status: DC | PRN
  Administered 2016-02-17: 40 ug/min via INTRAVENOUS

## 2016-02-17 MED ORDER — MORPHINE SULFATE (PF) 0.5 MG/ML IJ SOLN
INTRAMUSCULAR | Status: DC | PRN
  Administered 2016-02-17: .2 mg via INTRATHECAL

## 2016-02-17 MED ORDER — PHENYLEPHRINE 40 MCG/ML (10ML) SYRINGE FOR IV PUSH (FOR BLOOD PRESSURE SUPPORT)
PREFILLED_SYRINGE | INTRAVENOUS | Status: AC
  Filled 2016-02-17: qty 10

## 2016-02-17 MED ORDER — CEFAZOLIN SODIUM-DEXTROSE 2-3 GM-% IV SOLR
INTRAVENOUS | Status: DC | PRN
  Administered 2016-02-17: 2 g via INTRAVENOUS

## 2016-02-17 MED ORDER — MEPERIDINE HCL 25 MG/ML IJ SOLN
INTRAMUSCULAR | Status: AC
  Filled 2016-02-17: qty 1

## 2016-02-17 SURGICAL SUPPLY — 37 items
APL SKNCLS STERI-STRIP NONHPOA (GAUZE/BANDAGES/DRESSINGS) ×1
BENZOIN TINCTURE PRP APPL 2/3 (GAUZE/BANDAGES/DRESSINGS) ×2 IMPLANT
CLAMP CORD UMBIL (MISCELLANEOUS) IMPLANT
CLOSURE STERI STRIP 1/2 X4 (GAUZE/BANDAGES/DRESSINGS) ×1 IMPLANT
CLOTH BEACON ORANGE TIMEOUT ST (SAFETY) ×2 IMPLANT
CONTAINER PREFILL 10% NBF 15ML (MISCELLANEOUS) IMPLANT
DRSG OPSITE POSTOP 4X10 (GAUZE/BANDAGES/DRESSINGS) ×2 IMPLANT
DURAPREP 26ML APPLICATOR (WOUND CARE) ×2 IMPLANT
ELECT REM PT RETURN 9FT ADLT (ELECTROSURGICAL) ×2
ELECTRODE REM PT RTRN 9FT ADLT (ELECTROSURGICAL) ×1 IMPLANT
EXTRACTOR VACUUM M CUP 4 TUBE (SUCTIONS) IMPLANT
GLOVE BIO SURGEON STRL SZ 6.5 (GLOVE) ×3 IMPLANT
GLOVE BIO SURGEON STRL SZ7 (GLOVE) ×1 IMPLANT
GLOVE BIOGEL PI IND STRL 7.0 (GLOVE) ×1 IMPLANT
GLOVE BIOGEL PI INDICATOR 7.0 (GLOVE) ×4
GOWN STRL REUS W/TWL LRG LVL3 (GOWN DISPOSABLE) ×5 IMPLANT
KIT ABG SYR 3ML LUER SLIP (SYRINGE) IMPLANT
NDL HYPO 25X5/8 SAFETYGLIDE (NEEDLE) IMPLANT
NEEDLE HYPO 25X5/8 SAFETYGLIDE (NEEDLE) IMPLANT
NS IRRIG 1000ML POUR BTL (IV SOLUTION) ×2 IMPLANT
PACK C SECTION WH (CUSTOM PROCEDURE TRAY) ×2 IMPLANT
PAD OB MATERNITY 4.3X12.25 (PERSONAL CARE ITEMS) ×2 IMPLANT
PENCIL SMOKE EVAC W/HOLSTER (ELECTROSURGICAL) ×2 IMPLANT
RTRCTR C-SECT PINK 25CM LRG (MISCELLANEOUS) ×2 IMPLANT
STRIP CLOSURE SKIN 1/2X4 (GAUZE/BANDAGES/DRESSINGS) ×2 IMPLANT
SUT MNCRL 0 VIOLET CTX 36 (SUTURE) ×2 IMPLANT
SUT MONOCRYL 0 CTX 36 (SUTURE) ×2
SUT PLAIN 1 NONE 54 (SUTURE) IMPLANT
SUT PLAIN 2 0 XLH (SUTURE) ×2 IMPLANT
SUT VIC AB 0 CT1 27 (SUTURE) ×4
SUT VIC AB 0 CT1 27XBRD ANBCTR (SUTURE) ×2 IMPLANT
SUT VIC AB 2-0 CT1 27 (SUTURE) ×2
SUT VIC AB 2-0 CT1 TAPERPNT 27 (SUTURE) ×1 IMPLANT
SUT VIC AB 4-0 KS 27 (SUTURE) ×2 IMPLANT
SYR BULB IRRIGATION 50ML (SYRINGE) ×2 IMPLANT
TOWEL OR 17X24 6PK STRL BLUE (TOWEL DISPOSABLE) ×2 IMPLANT
TRAY FOLEY CATH SILVER 14FR (SET/KITS/TRAYS/PACK) ×2 IMPLANT

## 2016-02-17 NOTE — MAU Note (Signed)
Pt sent from office.  Low fluid, measuring small.  Failed NST today.

## 2016-02-17 NOTE — Anesthesia Procedure Notes (Signed)
Spinal Patient location during procedure: OR Start time: 02/17/2016 11:15 PM End time: 02/17/2016 11:16 PM Staffing Anesthesiologist: Suella Broad D Performed by: anesthesiologist  Preanesthetic Checklist Completed: patient identified, site marked, surgical consent, pre-op evaluation, timeout performed, IV checked, risks and benefits discussed and monitors and equipment checked Spinal Block Patient position: sitting Prep: Betadine Patient monitoring: heart rate, continuous pulse ox, blood pressure and cardiac monitor Approach: midline Location: L4-5 Injection technique: single-shot Needle Needle type: Whitacre and Introducer  Needle gauge: 24 G Needle length: 9 cm Additional Notes Negative paresthesia. Negative blood return. Positive free-flowing CSF. Expiration date of kit checked and confirmed. Patient tolerated procedure well, without complications.

## 2016-02-17 NOTE — H&P (Signed)
Jennifer [name] is a 30 y.o. female G1 @ 36+ with IUGR, oligohydramnios for delivery.  NST with variable decels.  D/W MFM, agree with delivery.  D/W pt r/b/a of LTCS, as remote from delivery.    Maternal Medical History:  Contractions: Frequency: irregular.    Fetal activity: Perceived fetal activity is normal.    Prenatal complications: IUGR.   Prenatal Complications - Diabetes: none.    OB History    Gravida Para Term Preterm AB TAB SAB Ectopic Multiple Living   1              Past Medical History  Diagnosis Date  . Asthma     Allergy-induced  . Bronchitis   . Fibromyalgia    Past Surgical History  Procedure Laterality Date  . Knee surgery    . Wisdom tooth extraction     Family History: HTN, endometriosis, DM2, lupus, thyroid cancer, Aspergers Social History:  reports that she has never smoked. She does not have any smokeless tobacco history on file. She reports that she does not drink alcohol or use illicit drugs. 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   HENT: Negative.   Eyes: Negative.   Respiratory: Negative.   Cardiovascular: Negative.   Gastrointestinal: Negative.   Genitourinary: Negative.   Musculoskeletal: Negative.   Skin: Negative.   Neurological: Negative.   Psychiatric/Behavioral: Negative.     Dilation: Closed Effacement (%): Thick Station: -3 Exam by:: Ginnie Smart RN Blood pressure 122/73, pulse 81, temperature 98 F (36.7 C),  temperature source Oral, resp. rate 16, height  (1.651 m), weight 101.152 kg (223 lb), last menstrual period 06/03/2015. Maternal Exam:  Abdomen: Patient reports no abdominal tenderness. Fundal height is small, 34-35cm.   Estimated fetal weight is 2-3#.   Fetal presentation: vertex  Introitus: Normal vulva. Normal vagina.  Cervix: Cervix evaluated by digital exam.     Physical Exam  Constitutional: She is oriented to person, place, and time. She appears well-developed and well-nourished.  HENT:  Head: Normocephalic and atraumatic.  Cardiovascular: Normal rate and regular rhythm.   Respiratory: Effort normal. No respiratory distress. She has no wheezes.  GI: Soft. Bowel sounds are normal. She exhibits no distension. There is no tenderness.  Musculoskeletal: Normal range of motion.  Neurological: She is alert and oriented to person, place, and time.  Skin: Skin is warm and dry.  Psychiatric: She has a normal mood and affect. Her behavior is normal.    Prenatal labs: ABO, Rh: --/--/O POS, O POS (06/12 1900) Antibody: NEG (06/12 1900) Rubella:  immune RPR:   NR HBsAg:   neg HIV:   neg GBS:   unknown, collected 6/12  Tdap 12/16/15  Bile acids 11.7/Hgb 14.9/Plt 216/ Ur Cx neg/ Zika neg/GC neg/Chl neg/ CF neg/ AFP WNL/ glucola 114  Female, nl anat, x limited heart  Today IUGR <10%, low AFI  SVE 0/0/-3  Short long bones on MFM [place]  Assessment/Plan: 29yo G1p0 at 36+ with IUGR infant and oligohydramnios  For LTCS - d/w  pt r/b/a    Bovard-Stuckert, Carah Barrientes 02/17/2016, 9:58 PM

## 2016-02-17 NOTE — Anesthesia Preprocedure Evaluation (Signed)
Anesthesia Evaluation  Patient identified by MRN, date of birth, ID band Patient awake    Reviewed: Allergy & Precautions, NPO status , Patient's Chart, lab work & pertinent test results  Airway Mallampati: III  TM Distance: >3 FB Neck ROM: Full    Dental  (+) Teeth Intact, Dental Advisory Given   Pulmonary asthma ,    breath sounds clear to auscultation       Cardiovascular negative cardio ROS   Rhythm:Regular Rate:Normal     Neuro/Psych  Neuromuscular disease negative psych ROS   GI/Hepatic negative GI ROS, Neg liver ROS,   Endo/Other  negative endocrine ROS  Renal/GU negative Renal ROS  negative genitourinary   Musculoskeletal  (+) Fibromyalgia -  Abdominal   Peds negative pediatric ROS (+)  Hematology negative hematology ROS (+)   Anesthesia Other Findings   Reproductive/Obstetrics (+) Pregnancy                             Lab Results  Component Value Date   WBC 12.4* 02/17/2016   HGB 14.1 02/17/2016   HCT 40.4 02/17/2016   MCV 87.1 02/17/2016   PLT 222 02/17/2016   No results found for: INR, PROTIME   Anesthesia Physical Anesthesia Plan  ASA: III  Anesthesia Plan: Spinal   Post-op Pain Management:    Induction:   Airway Management Planned: Natural Airway  Additional Equipment:   Intra-op Plan:   Post-operative Plan:   Informed Consent: I have reviewed the patients History and Physical, chart, labs and discussed the procedure including the risks, benefits and alternatives for the proposed anesthesia with the patient or authorized representative who has indicated his/her understanding and acceptance.   Dental advisory given  Plan Discussed with: CRNA  Anesthesia Plan Comments:         Anesthesia Quick Evaluation

## 2016-02-17 NOTE — MAU Provider Note (Signed)
History     CSN: [id number]  Arrival date and time: 02/17/16 1914   First Provider Initiated Contact with Patient 02/17/16 1837       Chief Complaint  Patient presents with  . failed non-stress test    HPI Jennifer [name] is a 30 y.o. G1P0 at [redacted]w[redacted]d who presents sent from the office for fetal monitoring and possible IOL. Patient reports failing her NST & BPP in the office. States baby is measuring small at about 2 lbs.  Denies abdominal pain, vaginal bleeding, LOF, headache, CP, SOB, or epigastric pain. Positive fetal movement.    OB History    Gravida Para Term Preterm AB TAB SAB Ectopic Multiple Living   1               Past Medical History  Diagnosis Date  . Asthma     Allergy-induced  . Bronchitis   . Fibromyalgia     Past Surgical History  Procedure Laterality Date  . Knee surgery    . Wisdom tooth extraction      History reviewed. No pertinent family history.  Social History  Substance Use Topics  . Smoking status: Never Smoker   . Smokeless tobacco: None  . Alcohol Use: No    Allergies: No Known Allergies  Prescriptions prior to admission  Medication Sig Dispense Refill Last Dose  . ondansetron (ZOFRAN ODT) 4 MG disintegrating tablet Take 1 tablet (4 mg total) by mouth every 6 (six) hours as needed for nausea. 10 tablet 0   . pantoprazole (PROTONIX) 40 MG tablet Take 40 mg by mouth daily.   01/11/2016 at Unknown time  . Prenatal Vit-Fe Fumarate-FA (PRENATAL MULTIVITAMIN) TABS tablet Take 1 tablet by mouth daily at 12 noon.   01/11/2016 at Unknown time  . ranitidine (ZANTAC) 150 MG tablet Take 150 mg by mouth 2 (two) times daily.   01/11/2016 at Unknown time    Review of Systems  Constitutional: Negative.   Eyes: Negative for blurred vision.  Gastrointestinal: Negative.   Genitourinary: Negative.   Neurological: Negative for headaches.   Physical Exam   Blood pressure 122/73, pulse 81, temperature 98 F (36.7 C), temperature source Oral, resp. rate  16, height 5\' 5"  (1.651 m), weight 223 lb (101.152 kg), last menstrual period 06/03/2015.  Patient Vitals for the past 24 hrs:  BP Temp Temp src Pulse Resp Height Weight  02/17/16 2005 122/73 mmHg - - 81 - - -  02/17/16 1950 116/68 mmHg - - 71 - - -  02/17/16 1935 126/73 mmHg - - 79 - - -  02/17/16 1920 147/88 mmHg - - 90 - - -  02/17/16 1909 144/88 mmHg - - 73 - - -  02/17/16 1905 136/78 mmHg - - 81 - - -  02/17/16 1853 - - - 109 - - -  02/17/16 1850 (!) 160/105 mmHg - - 97 - - -  02/17/16 1848 - - - 96 - - -  02/17/16 1847 (!) 159/101 mmHg - - 87 - - -  02/17/16 1821 141/87 mmHg 98 F (36.7 C) Oral 116 16 5\' 5"  (1.651 m) 223 lb (101.152 kg)    Physical Exam  Nursing note and vitals reviewed. Constitutional: She is oriented to person, place, and time. She appears well-developed and well-nourished. No distress.  HENT:  Head: Normocephalic and atraumatic.  Eyes: Conjunctivae are normal. Right eye exhibits no discharge. Left eye exhibits no discharge. No scleral icterus.  Neck: Normal range of motion.  Cardiovascular: Normal rate, regular rhythm and normal heart sounds.   No murmur heard. Respiratory: Effort normal and breath sounds normal. No respiratory distress. She has no wheezes.  GI: Soft. Bowel sounds are normal. There is no tenderness.  Musculoskeletal: She exhibits edema (BLE).  Neurological: She is alert and oriented to person, place, and time. She has normal reflexes.  No clonus  Skin: Skin is warm and dry. She is not diaphoretic.  Psychiatric: She has a normal mood and affect. Her behavior is normal. Judgment and thought content normal.   Dilation: Closed Effacement (%): Thick Cervical Position: Middle Station: -3 Exam by:: Ginnie Smart RN  Fetal Tracing:  Baseline: 135 Variability: moderate Accelerations: 10x10 Decelerations: variable decels  Toco: none    MAU Course  Procedures Results for orders placed or performed during the hospital encounter of  02/17/16 (from the past 24 hour(s))  Urinalysis, Routine w reflex microscopic (not at [facility])     Status: Abnormal   Collection Time: 02/17/16  6:15 PM  Result Value Ref Range   Color, Urine YELLOW YELLOW   APPearance CLEAR CLEAR   Specific Gravity, Urine <1.005 (L) 1.005 - 1.030   pH 5.5 5.0 - 8.0   Glucose, UA NEGATIVE NEGATIVE mg/dL   Hgb urine dipstick LARGE (A) NEGATIVE   Bilirubin Urine NEGATIVE NEGATIVE   Ketones, ur NEGATIVE NEGATIVE mg/dL   Protein, ur NEGATIVE NEGATIVE mg/dL   Nitrite NEGATIVE NEGATIVE   Leukocytes, UA NEGATIVE NEGATIVE  Protein / creatinine ratio, urine     Status: None   Collection Time: 02/17/16  6:15 PM  Result Value Ref Range   Creatinine, Urine 28.00 mg/dL   Total Protein, Urine <6 mg/dL   Protein Creatinine Ratio        0.00 - 0.15 mg/mg[Cre]  Urine microscopic-add on     Status: Abnormal   Collection Time: 02/17/16  6:15 PM  Result Value Ref Range   Squamous Epithelial / LPF 0-5 (A) NONE SEEN   WBC, UA 0-5 0 - 5 WBC/hpf   RBC / HPF 0-5 0 - 5 RBC/hpf   Bacteria, UA MANY (A) NONE SEEN  CBC     Status: Abnormal   Collection Time: 02/17/16  7:00 PM  Result Value Ref Range   WBC 12.4 (H) 4.0 - 10.5 K/uL   RBC 4.64 3.87 - 5.11 MIL/uL   Hemoglobin 14.1 12.0 - 15.0 g/dL   HCT 16.1 09.6 - 04.5 %   MCV 87.1 78.0 - 100.0 fL   MCH 30.4 26.0 - 34.0 pg   MCHC 34.9 30.0 - 36.0 g/dL   RDW 40.9 81.1 - 91.4 %   Platelets 222 150 - 400 K/uL  Comprehensive metabolic panel     Status: Abnormal   Collection Time: 02/17/16  7:00 PM  Result Value Ref Range   Sodium 134 (L) 135 - 145 mmol/L   Potassium 3.8 3.5 - 5.1 mmol/L   Chloride 105 101 - 111 mmol/L   CO2 22 22 - 32 mmol/L   Glucose, Bld 100 (H) 65 - 99 mg/dL   BUN 11 6 - 20 mg/dL   Creatinine, Ser 7.82 0.44 - 1.00 mg/dL   Calcium 8.7 (L) 8.9 - 10.3 mg/dL   Total Protein 6.0 (L) 6.5 - 8.1 g/dL   Albumin 3.1 (L) 3.5 - 5.0 g/dL   AST 18 15 - 41 U/L   ALT 17 14 - 54 U/L   Alkaline Phosphatase 140 (H)  38 - 126 U/L  Total Bilirubin 0.6 0.3 - 1.2 mg/dL   GFR calc non Af Amer >60 >60 mL/min   GFR calc Af Amer >60 >60 mL/min   Anion gap 7 5 - 15  Type and screen     Status: None   Collection Time: 02/17/16  7:00 PM  Result Value Ref Range   ABO/RH(D) O POS    Antibody Screen NEG    Sample Expiration 02/20/2016   ABO/Rh     Status: None   Collection Time: 02/17/16  7:00 PM  Result Value Ref Range   ABO/RH(D) O POS     MDM Per Dr. Ellyn HackBovard, will monitor pt, draw admission labs, & administer first dose of BMZ.   IV fluid bolus started for fetal deceleration PIH labs ordered d/t elevated BPs PIH labs normal BMZ given Patient continues to have variable decelerations with periods of recovery; Dr. Ellyn HackBovard updated regarding tracing & will come speak with patient regarding POC  Assessment and Plan  A: 1. Fetal heart deceleration   2. Hypertension in pregnancy, antepartum, third trimester      Judeth Hornrin Ahmeer Tuman 02/17/2016, 6:37 PM

## 2016-02-18 ENCOUNTER — Encounter (HOSPITAL_COMMUNITY): Payer: Self-pay

## 2016-02-18 DIAGNOSIS — IMO0002 Reserved for concepts with insufficient information to code with codable children: Secondary | ICD-10-CM | POA: Diagnosis present

## 2016-02-18 DIAGNOSIS — Z98891 History of uterine scar from previous surgery: Secondary | ICD-10-CM

## 2016-02-18 HISTORY — DX: History of uterine scar from previous surgery: Z98.891

## 2016-02-18 HISTORY — DX: Reserved for concepts with insufficient information to code with codable children: IMO0002

## 2016-02-18 LAB — CBC
HCT: 41.5 % (ref 36.0–46.0)
HEMATOCRIT: 38.8 % (ref 36.0–46.0)
HEMOGLOBIN: 14.6 g/dL (ref 12.0–15.0)
Hemoglobin: 13.7 g/dL (ref 12.0–15.0)
MCH: 30.5 pg (ref 26.0–34.0)
MCH: 30.5 pg (ref 26.0–34.0)
MCHC: 35.2 g/dL (ref 30.0–36.0)
MCHC: 35.3 g/dL (ref 30.0–36.0)
MCV: 86.8 fL (ref 78.0–100.0)
MCV: 86.4 fL (ref 78.0–100.0)
PLATELETS: 250 10*3/uL (ref 150–400)
Platelets: 235 10*3/uL (ref 150–400)
RBC: 4.78 MIL/uL (ref 3.87–5.11)
RBC: 4.49 MIL/uL (ref 3.87–5.11)
RDW: 13.9 % (ref 11.5–15.5)
RDW: 13.9 % (ref 11.5–15.5)
WBC: 24.3 10*3/uL — AB (ref 4.0–10.5)
WBC: 22.7 10*3/uL — AB (ref 4.0–10.5)

## 2016-02-18 LAB — RPR: RPR Ser Ql: NONREACTIVE

## 2016-02-18 MED ORDER — WITCH HAZEL-GLYCERIN EX PADS: 1.0000 "application " | MEDICATED_PAD | CUTANEOUS | Status: DC | PRN

## 2016-02-18 MED ORDER — OXYTOCIN 40 UNITS IN LACTATED RINGERS INFUSION - SIMPLE MED
2.5000 [IU]/h | INTRAVENOUS | Status: AC
Start: 2016-02-18 — End: 2016-02-18

## 2016-02-18 MED ORDER — CEFAZOLIN SODIUM-DEXTROSE 2-4 GM/100ML-% IV SOLN: 2.0000 g | INTRAVENOUS | Status: DC

## 2016-02-18 MED ORDER — LACTATED RINGERS IV SOLN: INTRAVENOUS | Status: DC

## 2016-02-18 MED ORDER — DIPHENHYDRAMINE HCL 25 MG PO CAPS
25.0000 mg | ORAL_CAPSULE | ORAL | Status: DC | PRN
  Filled 2016-02-18: qty 1

## 2016-02-18 MED ORDER — SIMETHICONE 80 MG PO CHEW
80.0000 mg | CHEWABLE_TABLET | Freq: Three times a day (TID) | ORAL | Status: DC
  Administered 2016-02-18 – 2016-02-20 (×7): 80 mg via ORAL
  Filled 2016-02-18 (×7): qty 1

## 2016-02-18 MED ORDER — NALBUPHINE HCL 10 MG/ML IJ SOLN: 5.0000 mg | INTRAMUSCULAR | Status: DC | PRN

## 2016-02-18 MED ORDER — NALBUPHINE HCL 10 MG/ML IJ SOLN: 5.0000 mg | Freq: Once | INTRAMUSCULAR | Status: DC | PRN

## 2016-02-18 MED ORDER — DEXTROSE 5 % IV SOLN
1.0000 ug/kg/h | INTRAVENOUS | Status: DC | PRN
  Filled 2016-02-18: qty 2

## 2016-02-18 MED ORDER — DIPHENHYDRAMINE HCL 25 MG PO CAPS: 25.0000 mg | ORAL_CAPSULE | Freq: Four times a day (QID) | ORAL | Status: DC | PRN

## 2016-02-18 MED ORDER — ONDANSETRON HCL 4 MG/2ML IJ SOLN: 4.0000 mg | Freq: Three times a day (TID) | INTRAMUSCULAR | Status: DC | PRN

## 2016-02-18 MED ORDER — KETOROLAC TROMETHAMINE 30 MG/ML IJ SOLN: 30.0000 mg | Freq: Four times a day (QID) | INTRAMUSCULAR | Status: AC | PRN

## 2016-02-18 MED ORDER — PROMETHAZINE HCL 25 MG/ML IJ SOLN: 6.2500 mg | INTRAMUSCULAR | Status: DC | PRN

## 2016-02-18 MED ORDER — DIBUCAINE 1 % RE OINT: 1.0000 "application " | TOPICAL_OINTMENT | RECTAL | Status: DC | PRN

## 2016-02-18 MED ORDER — OXYCODONE HCL 5 MG PO TABS: 5.0000 mg | ORAL_TABLET | ORAL | Status: DC | PRN

## 2016-02-18 MED ORDER — COCONUT OIL OIL: 1.0000 "application " | TOPICAL_OIL | Status: DC | PRN

## 2016-02-18 MED ORDER — NALOXONE HCL 0.4 MG/ML IJ SOLN: 0.4000 mg | INTRAMUSCULAR | Status: DC | PRN

## 2016-02-18 MED ORDER — MEPERIDINE HCL 25 MG/ML IJ SOLN: 6.2500 mg | INTRAMUSCULAR | Status: DC | PRN

## 2016-02-18 MED ORDER — ZOLPIDEM TARTRATE 5 MG PO TABS: 5.0000 mg | ORAL_TABLET | Freq: Every evening | ORAL | Status: DC | PRN

## 2016-02-18 MED ORDER — SIMETHICONE 80 MG PO CHEW: 80.0000 mg | CHEWABLE_TABLET | ORAL | Status: DC | PRN

## 2016-02-18 MED ORDER — DIPHENHYDRAMINE HCL 50 MG/ML IJ SOLN: 12.5000 mg | INTRAMUSCULAR | Status: DC | PRN

## 2016-02-18 MED ORDER — FENTANYL CITRATE (PF) 100 MCG/2ML IJ SOLN: 25.0000 ug | INTRAMUSCULAR | Status: DC | PRN

## 2016-02-18 MED ORDER — PRENATAL MULTIVITAMIN CH
1.0000 | ORAL_TABLET | Freq: Every day | ORAL | Status: DC
  Administered 2016-02-18 – 2016-02-19 (×2): 1 via ORAL
  Filled 2016-02-18 (×2): qty 1

## 2016-02-18 MED ORDER — NALBUPHINE HCL 10 MG/ML IJ SOLN
INTRAMUSCULAR | Status: AC
  Filled 2016-02-18: qty 1

## 2016-02-18 MED ORDER — ACETAMINOPHEN 325 MG PO TABS: 650.0000 mg | ORAL_TABLET | ORAL | Status: DC | PRN

## 2016-02-18 MED ORDER — PANTOPRAZOLE SODIUM 40 MG PO TBEC
40.0000 mg | DELAYED_RELEASE_TABLET | Freq: Every day | ORAL | Status: DC
  Administered 2016-02-18 – 2016-02-19 (×2): 40 mg via ORAL
  Filled 2016-02-18 (×2): qty 1

## 2016-02-18 MED ORDER — LORATADINE 10 MG PO TABS
10.0000 mg | ORAL_TABLET | Freq: Every day | ORAL | Status: DC
  Administered 2016-02-18 – 2016-02-19 (×2): 10 mg via ORAL
  Filled 2016-02-18 (×3): qty 1

## 2016-02-18 MED ORDER — NALBUPHINE HCL 10 MG/ML IJ SOLN
5.0000 mg | INTRAMUSCULAR | Status: DC | PRN
  Administered 2016-02-18: 5 mg via INTRAVENOUS

## 2016-02-18 MED ORDER — SCOPOLAMINE 1 MG/3DAYS TD PT72
1.0000 | MEDICATED_PATCH | Freq: Once | TRANSDERMAL | Status: DC
  Filled 2016-02-18: qty 1

## 2016-02-18 MED ORDER — MENTHOL 3 MG MT LOZG: 1.0000 | LOZENGE | OROMUCOSAL | Status: DC | PRN

## 2016-02-18 MED ORDER — SODIUM CHLORIDE 0.9% FLUSH: 3.0000 mL | INTRAVENOUS | Status: DC | PRN

## 2016-02-18 MED ORDER — SIMETHICONE 80 MG PO CHEW
80.0000 mg | CHEWABLE_TABLET | ORAL | Status: DC
  Administered 2016-02-19 – 2016-02-20 (×2): 80 mg via ORAL
  Filled 2016-02-18 (×2): qty 1

## 2016-02-18 MED ORDER — IBUPROFEN 800 MG PO TABS
800.0000 mg | ORAL_TABLET | Freq: Three times a day (TID) | ORAL | Status: DC
  Administered 2016-02-18 – 2016-02-20 (×5): 800 mg via ORAL
  Filled 2016-02-18 (×7): qty 1

## 2016-02-18 MED ORDER — SENNOSIDES-DOCUSATE SODIUM 8.6-50 MG PO TABS
2.0000 | ORAL_TABLET | ORAL | Status: DC
  Administered 2016-02-19: 2 via ORAL
  Filled 2016-02-18 (×2): qty 2

## 2016-02-18 MED ORDER — OXYCODONE HCL 5 MG PO TABS: 10.0000 mg | ORAL_TABLET | ORAL | Status: DC | PRN

## 2016-02-18 NOTE — Lactation Note (Signed)
This note was copied from a baby's chart. Lactation Consultation Note  Patient Name: Boy Winta Barcelo RRNHA'F Date: 02/18/2016   Baby 38 hours old. Assisted mom to latch baby to right breast in football position. Baby fussy at breast and pushing away from breast with hands outstretched. Baby would not suckle this LC's gloved finger. Enc mom to continue to hold baby STS and allow baby to nuzzle. Discussed with mom the benefits of STS and attempts at breast. Enc mom to pump after this attempt, and to pump every 2-3 hours for a total of at least 8 times/24 hours.   Met with mom in her room and mom return-demonstrated hand expression, with no colostrum present. Discussed progression of milk coming to volume. Mom given [facility] brochure and NICU booklet with review. Enc mom to take EBM to NICU. Mom states that she has a DEBP at home. Discussed the benefits of hospital-grade pump and mom aware of 2-week DEBP rental. Mom also aware of pumping rooms in the NICU.  Maternal Data    Feeding Feeding Type: Donor Breast Milk  LATCH Score/Interventions                      Lactation Tools Discussed/Used     Consult Status      Inocente Salles 02/18/2016, 5:37 PM

## 2016-02-18 NOTE — Anesthesia Postprocedure Evaluation (Signed)
Anesthesia Post Note  Patient: Geanie Cooleymanda R Kolk  Procedure(s) Performed: Procedure(s) (LRB): CESAREAN SECTION (N/A)  Patient location during evaluation: [facility] Anesthesia Type: General Level of consciousness: awake and alert Pain management: pain level controlled Vital Signs Assessment: post-procedure vital signs reviewed and stable Respiratory status: spontaneous breathing, nonlabored ventilation and respiratory function stable Cardiovascular status: blood pressure returned to baseline and stable Postop Assessment: no signs of nausea or vomiting Anesthetic complications: no     Last Vitals:  Filed Vitals:   02/18/16 0420 02/18/16 0635  BP: 118/79 120/85  Pulse: 72 70  Temp: 36.7 C 36.7 C  Resp: 20 18    Last Pain:  Filed Vitals:   02/18/16 0729  PainSc: 1    Pain Goal: Patients Stated Pain Goal: 3 (02/18/16 0650)               Junious SilkGILBERT,Alyn Riedinger

## 2016-02-18 NOTE — Op Note (Signed)
[name], Jennifer [name]             ACCOUNT NO.:  [id number]  MEDICAL RECORD NO.:  [id number]  LOCATION:  [facility]                          FACILITY:  [facility]  PHYSICIAN:  Sherron Monday, MD        DATE OF BIRTH:  1985/12/03  DATE OF PROCEDURE:  02/17/2016 DATE OF DISCHARGE:                              OPERATIVE REPORT   PREOPERATIVE DIAGNOSES:  Severe intrauterine  growth restriction; oligohydramnios, remote from delivery.  POSTOPERATIVE DIAGNOSES:  Severe intrauterine growth restriction; oligohydramnios, remote from delivery, delivered.  PROCEDURE:  Primary low-transverse cesarean section.  SURGEON:  Sherron Monday, MD  ASSISTANT:  Jossie Ng, RNFA.  FINDINGS:  Viable female infant at 2337 with Apgars of 8 at 1 minute and 8 at 5 minutes, and the weight of 3 pounds and 4 ounces.  Normal uterus, tubes, and ovaries bilaterally.  ANESTHESIA:  Spinal.  EBL:  525 mL.  URINE OUTPUT:  300 mL clear urine at the end of the procedure.  IV FLUIDS:  1500 mL.  COMPLICATIONS:  None.  PATHOLOGY:  Placenta to Pathology.  DESCRIPTION OF PROCEDURE:  After informed consent was reviewed with the patient and her husband, she was transported to the operating room where spinal anesthesia was placed and found to be adequate.  She was then returned to the supine position with a leftward tilt.  After an appropriate time-out had been performed, she was prepped and draped in the normal sterile fashion.  Pfannenstiel skin incision was made at the level of approximately 2 fingerbreadths above the pubic symphysis, carried through to the underlying layer of fascia sharply.  The fascia was incised in the midline.  The incision was extended laterally with Mayo scissors.  Then, the superior aspect of the fascial incision was grasped with Kocher clamps, elevated, the rectus muscles were dissected off both bluntly and sharply.  Midline was easily identified and the peritoneum was entered bluntly.  The incision  was extended superiorly and inferiorly with good visualization.  An Alexis skin retractor was placed, carefully making sure that no bowel was entrapped.  The uterus was inspected, the uterus was incised in a transverse fashion.  Infant was delivered from a vertex presentation.  Nose and mouth were suctioned on the field.  Cord was clamped and cut.  Infant was handed off to the waiting pediatric staff.  The placenta was expressed from the uterus. The uterus was cleared of all clot and debris.  Uterine incision was closed with two layers of 0 Monocryl, the first of which is running locked and second as an imbricating layer.  This was noted to be hemostatic.  The gutters were cleared of all clots and debris.  The Alexis skin retractor was removed.  The peritoneum was reapproximated with 2-0 Vicryl.  The fascia was closed with 0 Vicryl in a single suture and the subcuticular adipose layer was made hemostatic with Bovie cautery and the dead space was closed with plain gut.  The skin was closed with 4-0 Vicryl on a Keith needle in subcuticular fashion. Benzoin and Steri-Strips were applied.  The patient tolerated the procedure well.  Sponge, lap, and needle counts were correct x2 per the operating staff.  Sherron MondayJody Bovard, MD     JB/MEDQ  D:  02/18/2016  T:  02/18/2016  Job:  [id number]

## 2016-02-18 NOTE — Transfer of Care (Signed)
Immediate Anesthesia Transfer of Care Note  Patient: Jennifer CooleyAmanda R Hirano  Procedure(s) Performed: Procedure(s): CESAREAN SECTION (N/A)  Patient Location: PACU  Anesthesia Type:Spinal  Level of Consciousness: awake, alert  and oriented  Airway & Oxygen Therapy: Patient Spontanous Breathing  Post-op Assessment: Report given to RN and Post -op Vital signs reviewed and stable  Post vital signs: Reviewed and stable  Last Vitals:  Filed Vitals:   02/17/16 2222 02/17/16 2228  BP: 148/84 145/76  Pulse: 79 89  Temp:    Resp:      Last Pain: There were no vitals filed for this visit.       Complications: No apparent anesthesia complications

## 2016-02-18 NOTE — Addendum Note (Signed)
Addendum  created 02/18/16 0758 by Junious SilkMelinda Akili Corsetti, CRNA   Modules edited: Clinical Notes   Clinical Notes:  File: [id number]

## 2016-02-18 NOTE — Anesthesia Postprocedure Evaluation (Signed)
Anesthesia Post Note  Patient: Jennifer [name]  Procedure(s) Performed: Procedure(s) (LRB): CESAREAN SECTION (N/A)  Patient location during evaluation: PACU Anesthesia Type: Spinal Level of consciousness: oriented and awake and alert Pain management: pain level controlled Vital Signs Assessment: post-procedure vital signs reviewed and stable Respiratory status: spontaneous breathing, respiratory function stable and patient connected to nasal cannula oxygen Cardiovascular status: blood pressure returned to baseline and stable Postop Assessment: no headache, no backache and spinal receding Anesthetic complications: no     Last Vitals:  Filed Vitals:   02/18/16 0031 02/18/16 0045  BP: 103/54 134/78  Pulse: 70 68  Temp: 36.6 C   Resp: 17 16    Last Pain: There were no vitals filed for this visit. Pain Goal:                 Shelton SilvasKevin D Darel Ricketts

## 2016-02-18 NOTE — Brief Op Note (Signed)
02/17/2016 - 02/18/2016  12:31 AM  PATIENT:  Jennifer CooleyAmanda R [name]  30 y.o. female  PRE-OPERATIVE DIAGNOSIS: severe IUGR, oligohydramnios Remote from delivery  POST-OPERATIVE DIAGNOSIS: severe IUGR, oligohydramnios, remote from delivery, delivered  PROCEDURE:  Procedure(s): CESAREAN SECTION (N/A)  SURGEON:  Surgeon(s) and Role:    * Sherian ReinJody Bovard-Stuckert, MD - Primary  ASSISTANTS: Emilio Matharpenter, Leanne RNFA   FINDINGS: viable female infant at 23:37, apgars 8 at 1min and 8 at 5min, weight 3#4, nl uterus, tubes and ovaries, B.    ANESTHESIA:   spinal  EBL:  Total I/O In: 1500 [I.V.:1500] Out: 825 [Urine:300; Blood:525]  BLOOD ADMINISTERED:none  DRAINS: Urinary Catheter (Foley)   LOCAL MEDICATIONS USED:  NONE  SPECIMEN:  Source of Specimen:  Placenta  DISPOSITION OF SPECIMEN:  PATHOLOGY  COUNTS:  YES  TOURNIQUET:  * No tourniquets in log *  DICTATION: .Other Dictation: Dictation Number [id number]  PLAN OF CARE: Admit to inpatient   PATIENT DISPOSITION:  PACU - hemodynamically stable.   Delay start of Pharmacological VTE agent (>24hrs) due to surgical blood loss or risk of bleeding: not applicable

## 2016-02-18 NOTE — Progress Notes (Signed)
CSW attempted to meet with MOB to offer support and complete assessment due to baby's admission to NICU at 36.2 weeks.  MOB had multiple visitors at this time.  CSW will attempt again at a later time.  MOB states she is feeling well and appeared to be in good spirits. 

## 2016-02-18 NOTE — Progress Notes (Signed)
Subjective: Postpartum Day 1: Cesarean Delivery Patient reports tolerating PO and no problems voiding.  Feels well. Relieved baby is out and can be monitored. Denies any fever, chills, HA or CP. No complaints  Objective: Vital signs in last 24 hours: Temp:  [97.9 F (36.6 C)-98.8 F (37.1 C)] 98.1 F (36.7 C) (06/13 0635) Pulse Rate:  [60-116] 70 (06/13 0635) Resp:  [15-24] 18 (06/13 0635) BP: (103-160)/(54-105) 120/85 mmHg (06/13 0635) SpO2:  [94 %-100 %] 98 % (06/13 0635) Weight:  [223 lb (101.152 kg)] 223 lb (101.152 kg) (06/12 1821)  Physical Exam:  General: alert, cooperative and no distress Lochia: appropriate Uterine Fundus: firm Incision: dressing c/d/i DVT Evaluation: No evidence of DVT seen on physical exam.   Recent Labs  02/17/16 1900 02/18/16 0627  HGB 14.1 14.6  HCT 40.4 41.5    Assessment/Plan: Status post Cesarean section. Doing well postoperatively.  Monitor wbc (currently at 24);afeb  Mercy HospitalCecilia Worema Terra [name] 02/18/2016, 9:14 AM

## 2016-02-19 NOTE — Lactation Note (Signed)
This note was copied from a baby's chart. Lactation Consultation Note  Follow up visit made.  Mom states she is pumping every 2-3 hours and obtaining a few mls of colostrum.  Mom is following pumping with hand expression and obtains a few drops.  Reviewed milk coming to volume.  Mom has a medela pump in style at home.  Encouraged to call with concerns/assist.  Patient Name: Jennifer Hal Moralesmanda Burck ZHYQM'VToday's Date: 02/19/2016 Reason for consult: Initial assessment;NICU baby   Maternal Data    Feeding    LATCH Score/Interventions                      Lactation Tools Discussed/Used Initiated by:: RN Date initiated:: 02/19/16   Consult Status Consult Status: Follow-up Date: 02/20/16 Follow-up type: In-patient    Huston FoleyMOULDEN, Bathsheba Durrett S 02/19/2016, 9:56 AM

## 2016-02-19 NOTE — Progress Notes (Signed)
Patient ID: Jennifer CooleyAmanda R Manzella, female   DOB: [birth year], 30 y.o.   MRN: [id number] WBC trending down - from 24 to 22

## 2016-02-19 NOTE — Progress Notes (Signed)
Subjective: Postpartum Day 2: Cesarean Delivery Patient reports incisional pain, tolerating PO and no problems voiding.    Objective: Vital signs in last 24 hours: Temp:  [98 F (36.7 C)-98.4 F (36.9 C)] 98.1 F (36.7 C) (06/14 0733) Pulse Rate:  [55-84] 55 (06/14 0733) Resp:  [14-18] 14 (06/14 0733) BP: (99-145)/(48-84) 124/73 mmHg (06/14 0733) SpO2:  [97 %-100 %] 100 % (06/14 0733)  Physical Exam:  General: alert, cooperative and no distress Lochia: appropriate Uterine Fundus: firm Incision: healing well DVT Evaluation: No evidence of DVT seen on physical exam.   Recent Labs  02/18/16 0627 02/18/16 1218  HGB 14.6 13.7  HCT 41.5 38.8    Assessment/Plan: Status post Cesarean section. Doing well postoperatively.  Continue current care.  Baby in NICU - doing well no abx, some O2  Jennifer [name], Jennifer [name] 02/19/2016, 8:03 AM

## 2016-02-20 MED ORDER — PRENATAL MULTIVITAMIN CH: 1.0000 | ORAL_TABLET | Freq: Every day | ORAL | Status: DC

## 2016-02-20 MED ORDER — IBUPROFEN 800 MG PO TABS: 800.0000 mg | ORAL_TABLET | Freq: Three times a day (TID) | ORAL | Status: DC | PRN

## 2016-02-20 MED ORDER — OXYCODONE HCL 5 MG PO TABS
5.0000 mg | ORAL_TABLET | Freq: Four times a day (QID) | ORAL | Status: DC | PRN
Start: 2016-02-20 — End: 2019-01-16

## 2016-02-20 NOTE — Progress Notes (Signed)
UR chart review completed.  

## 2016-02-20 NOTE — Clinical Social Work Maternal (Signed)
CLINICAL SOCIAL WORK MATERNAL/CHILD NOTE  Patient Details  Name: Jennifer [name] MRN: [id number] Date of Birth: 06/04/1986  Date:  02/19/2016  Clinical Social Worker Initiating Note:  Terri Piedra, [place] Date/ Time Initiated:  02/19/16/1600     Child's Name:  Jennifer [name]   Legal Guardian:   (Parents: Jennifer [name] and Jennifer [name])   Need for Interpreter:  None   Date of Referral:        Reason for Referral:   (no referral-NICU admission)   Referral Source:      Address:  [address] [place], [place] [address]  Phone number:  [phone]   Household Members:  Significant Other   Natural Supports (not living in the home):  Immediate Family, Extended Family, Friends (Parents report having a great support system-Most all family lives locally.)   Professional Supports: None   Employment: Full-time   Type of Work:  (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  Strengths:  Ability to meet basic needs , Compliance with medical plan , Pediatrician chosen , Understanding of illness, Home prepared for child  (Pediatric follow up will be at [facility].)   Risk Factors/Current Problems:  None   Cognitive State:  Able to Concentrate , Alert , Linear Thinking , Insightful    Mood/Affect:  Euthymic , Interested , Calm , Comfortable , Relaxed    CSW Assessment: CSW met with parents in MOB's third floor room/309 to introduce services, offer support and complete assessment due to baby's admission to NICU at 36.2 weeks.  Parents were very pleasant and receptive to CSW's visit.  CSW found both MOB and FOB easy to engage and attentive during assessment. MOB reports that she is feeling much better physically, but reports some soreness that she expects is normal after a  c-section.  FOB shared their story of learning of the need to deliver by c-section and how this was unexpected.  Both parents state that they feel they are coping well with MOB's delivery and baby's admission to NICU.  FOB added that it was estimated that baby was measuring at 2 pounds prior to delivery and that they are thankful he is bigger than this.  They appear to have a good understanding of why baby is in the NICU, but FOB states he does not understand why baby's O2 sats were dropping.  CSW informed him that CSW cannot answer this, but can find an MD or NNP for them to talk to if desired.  CSW also explained that they can request a family conference at any time during baby's hospitalization to ensure questions are being answered/good communication.  CSW asked them to contact CSW if they would like to arrange at any time.  They declined need to speak with a provider at this time. CSW explained baby's potential eligibility for Supplemental Security Income (SSI) through the [place] due to baby's gestational age and birth weight.  Parents seem interested, stated understanding of application process and that the hospital has no control over this process, and seemed appreciative of the information.   Parents report having a great support system and everything they need for baby at home.  CSW briefly reviewed SIDS precautions, which parents are aware of.   CSW provided education regarding perinatal mood disorders and  discussed the importance of talking with CSW and or MD if emotional concerns arise.  Parents were attentive and state a commitment to monitoring mental health. CSW explained ongoing support services offered by NICU CSW and gave contact information.  Parents were appreciative of the visit and state no questions, concerns or needs at this time.  CSW Plan/Description:  Information/Referral to [employer] , Engineer, mining , Psychosocial Support and Ongoing  Assessment of Needs    Jennifer [name], [place] 02/19/2016, 4:00 PM

## 2016-02-20 NOTE — Discharge Summary (Signed)
OB Discharge Summary     Patient Name: Jennifer [name] DOB: 11/26/1985 MRN: [id number]  Date of admission: 02/17/2016 Delivering MD: [name], Garnetta Fedrick   Date of discharge: 02/20/2016  Admitting diagnosis: 36.2w low fluid, cholestasis, failed nst,bpp Intrauterine pregnancy: 7437w2d     Secondary diagnosis:  Principal Problem:   S/P cesarean section Active Problems:   IUGR (intrauterine growth restriction)   Postpartum care following cesarean delivery  Additional problems: N/A     Discharge diagnosis: Preterm Pregnancy Delivered                                                                                                Post partum procedures:N/A  Augmentation: N/A  Complications: None  Hospital course:  Sceduled C/S   30 y.o. yo G1P0101 at 7837w2d was admitted to the hospital 02/17/2016 for scheduled cesarean section with the following indication:IUGR, remote from delivery.  Membrane Rupture Time/Date: 11:37 PM ,02/17/2016   Patient delivered a Viable infant.02/17/2016  Details of operation can be found in separate operative note.  Pateint had an uncomplicated postpartum course.  She is ambulating, tolerating a regular diet, passing flatus, and urinating well. Patient is discharged home in stable condition on  02/20/2016          Physical exam  Filed Vitals:   02/19/16 1246 02/19/16 1811 02/19/16 2204 02/20/16 0621  BP: 114/78 114/73 94/66 110/66  Pulse: 78 87 93 72  Temp: 97.6 F (36.4 C) 98.6 F (37 C) 98.3 F (36.8 C) 98.6 F (37 C)  TempSrc: Oral Oral Oral Oral  Resp: 16 18 18 16   Height:      Weight:      SpO2: 100% 100% 98% 100%   General: alert and no distress Lochia: appropriate Uterine Fundus: firm Incision: Healing well with no significant drainage DVT Evaluation: No evidence of DVT seen on physical exam. Labs: Lab Results  Component Value Date   WBC 22.7* 02/18/2016   HGB 13.7 02/18/2016   HCT 38.8 02/18/2016   MCV 86.4 02/18/2016   PLT 250  02/18/2016   CMP Latest Ref Rng 02/17/2016  Glucose 65 - 99 mg/dL 409(W100(H)  BUN 6 - 20 mg/dL 11  Creatinine 1.190.44 - 1.471.00 mg/dL 8.290.61  Sodium 562135 - 130145 mmol/L 134(L)  Potassium 3.5 - 5.1 mmol/L 3.8  Chloride 101 - 111 mmol/L 105  CO2 22 - 32 mmol/L 22  Calcium 8.9 - 10.3 mg/dL 8.6(V8.7(L)  Total Protein 6.5 - 8.1 g/dL 6.0(L)  Total Bilirubin 0.3 - 1.2 mg/dL 0.6  Alkaline Phos 38 - 126 U/L 140(H)  AST 15 - 41 U/L 18  ALT 14 - 54 U/L 17    Discharge instruction: per After Visit Summary and "Baby and Me Booklet".  After visit meds:    Medication List    TAKE these medications        acetaminophen 500 MG tablet  Commonly known as:  TYLENOL  Take 1,000 mg by mouth every 6 (six) hours as needed for mild pain.     ibuprofen 800 MG tablet  Commonly known as:  ADVIL,MOTRIN  Take 1 tablet (800 mg total) by mouth every 8 (eight) hours as needed for moderate pain.     loratadine 10 MG tablet  Commonly known as:  CLARITIN  Take 10 mg by mouth daily.     ondansetron 4 MG disintegrating tablet  Commonly known as:  ZOFRAN ODT  Take 1 tablet (4 mg total) by mouth every 6 (six) hours as needed for nausea.     oxyCODONE 5 MG immediate release tablet  Commonly known as:  Oxy IR/ROXICODONE  Take 1 tablet (5 mg total) by mouth every 6 (six) hours as needed for severe pain.     pantoprazole 40 MG tablet  Commonly known as:  PROTONIX  Take 40 mg by mouth daily.     prenatal multivitamin Tabs tablet  Take 1 tablet by mouth daily at 12 noon.     ranitidine 150 MG tablet  Commonly known as:  ZANTAC  Take 150 mg by mouth 2 (two) times daily.        Diet: routine diet  Activity: Advance as tolerated. Pelvic rest for 6 weeks.   Outpatient follow up:2 and 6 weeks Follow up Appt:No future appointments. Follow up Visit:No Follow-up on file.  Postpartum contraception: Undecided  Newborn Data: Live born female  Birth Weight: 3 lb 3.9 oz (1470 g) APGAR: 8, 8  Baby Feeding: Bottle and  Breast Disposition:NICU   02/20/2016 Sherian Rein, MD

## 2016-02-20 NOTE — Progress Notes (Signed)
Subjective: Postpartum Day 3: Cesarean Delivery Patient reports incisional pain, tolerating PO and no problems voiding.    Objective: Vital signs in last 24 hours: Temp:  [97.6 F (36.4 C)-98.6 F (37 C)] 98.6 F (37 C) (06/15 [id number]) Pulse Rate:  [72-93] 72 (06/15 0621) Resp:  [16-18] 16 (06/15 0621) BP: (94-114)/(66-78) 110/66 mmHg (06/15 0621) SpO2:  [98 %-100 %] 100 % (06/15 [id number])  Physical Exam:  General: alert and no distress Lochia: appropriate Uterine Fundus: firm Incision: healing well DVT Evaluation: No evidence of DVT seen on physical exam.   Recent Labs  02/18/16 0627 02/18/16 1218  HGB 14.6 13.7  HCT 41.5 38.8    Assessment/Plan: Status post Cesarean section. Doing well postoperatively.   Discharge home with standard precautions and return to clinic in 2 weeks.  D/c  With motrin, percocet, or prenatal vitamins.    Jennifer [name] 02/20/2016, 7:50 AM

## 2016-02-20 NOTE — Progress Notes (Signed)
Discharge teaching complete. Pt understood all instructions and did not have any questions. Pt ambulated out of the room and discharged home to family.

## 2016-02-21 ENCOUNTER — Ambulatory Visit: Payer: Self-pay

## 2016-02-21 NOTE — Lactation Note (Signed)
This note was copied from a baby's chart. Lactation Consultation Note  Patient Name: [name]'UToday's Date: 02/21/2016 Reason for consult: Follow-up assessment;NICU baby;Infant < 6lbs   Called to NICU for consult. Mom with s/s engorgement. She reports her right breast is producing 20 cc/pumping and left breast 2-3 cc. Left breast firm and knotty. Ice packs given to mom with Engorgement Treatment sheet. Advised mom to use Symphony while visiting infant and PIS when at home. S/S mastitis given with instructions to call OB is any appear. Mom voiced understanding. Follow up as needed.    Maternal Data    Feeding    LATCH Score/Interventions                      Lactation Tools Discussed/Used Pump Review: Setup, frequency, and cleaning;Milk Storage   Consult Status Consult Status: PRN Follow-up type: Call as needed    Ed BlalockSharon S Natara Monfort 02/21/2016, 4:36 PM

## 2016-02-24 ENCOUNTER — Ambulatory Visit: Payer: Self-pay

## 2016-02-24 NOTE — Lactation Note (Signed)
This note was copied from a baby's chart. Lactation Consultation Note  Patient Name: Jennifer [name] AVWUJ'WToday's Date: 02/24/2016 Reason for consult: Follow-up assessment;NICU baby;Infant < 6lbs;Late preterm infant   Follow up with mom in NICU. Mom reports engorgement resolved and pumping is going well. She has no questions at this time. Follow up prn.   Maternal Data    Feeding Feeding Type: Breast Milk Length of feed: 40 min (10"PO/30"NG)  LATCH Score/Interventions                      Lactation Tools Discussed/Used     Consult Status Consult Status: PRN Follow-up type: Call as needed    Ed BlalockSharon S Starsky Nanna 02/24/2016, 1:40 PM

## 2016-02-25 ENCOUNTER — Inpatient Hospital Stay (HOSPITAL_COMMUNITY): Admission: RE | Admit: 2016-02-25 | Payer: Managed Care, Other (non HMO) | Source: Ambulatory Visit

## 2016-03-06 ENCOUNTER — Ambulatory Visit: Payer: Self-pay

## 2016-03-06 NOTE — Lactation Note (Signed)
This note was copied from a baby's chart. Lactation Consultation Note  Patient Name: Jennifer [name] UJWJX'Hal MoralesBToday's Date: 03/06/2016 Reason for consult: Follow-up assessment;NICU baby  NICU baby 392 weeks old. Mom reports shooting pains in the underside of both breasts. Breasts are full and heavy, but not red, nipples are unremarkable/WNL, and no plugged ducts present with palpation. Mom reports that she has been pumping every 3 hours, but when asked for pumping times, it has been more like 4 hours between use of DEBP. Mom's breasts feel engorged, the left is more engorged than the right. Refitted mom with #27 flanges and enc mom to pump every 2-3 hours. Enc mom to pump a few minutes past her last milk flow with each pumping. Enc mom to massage, pump, and then hand express to soften breasts. Discussed how engorgement and reduced pumping can impact milk supply.   Maternal Data    Feeding Feeding Type: Breast Milk Nipple Type: Slow - flow Length of feed: 30 min  LATCH Score/Interventions                      Lactation Tools Discussed/Used     Consult Status Consult Status: PRN    Geralynn OchsWILLIARD, [name] 03/06/2016, 2:28 PM

## 2016-03-11 ENCOUNTER — Ambulatory Visit: Payer: Self-pay

## 2016-03-11 NOTE — Lactation Note (Signed)
This note was copied from a baby's chart. Lactation Consultation Note  Met with mom in the NICU.  She states that pumping and milk supply is good.  I asked mom if she would like to put baby to breast and she stated she thinks she will pump and bottle feed.  Encouraged to call if she has any concerns.  Patient Name: Jennifer [name] XAJOI'N Date: 03/11/2016     Maternal Data    Feeding    LATCH Score/Interventions                      Lactation Tools Discussed/Used     Consult Status      Ave Filter 03/11/2016, 3:14 PM

## 2016-10-04 IMAGING — US US MFM OB DETAIL+14 WK
1 series · 13 of 28 positions shown · non-contrast
Comparison: none

[Series 1: us mfm ob detail+14 wk · 13 of 76 slices shown]
[im 3/76]
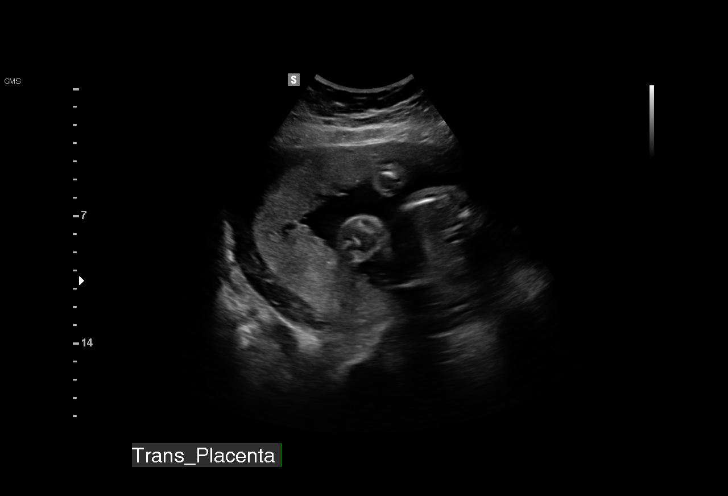
[im 9/76]
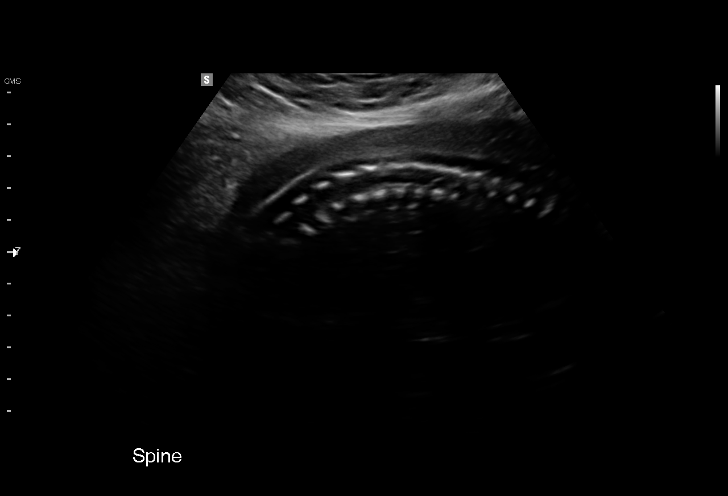
[im 14/76]
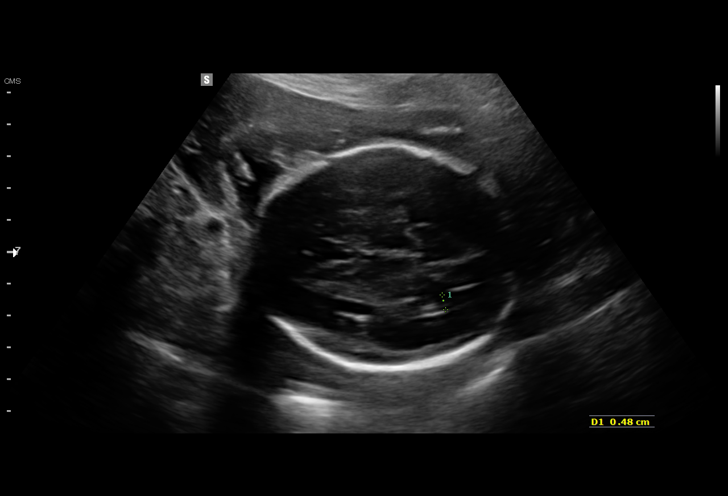
[im 20/76]
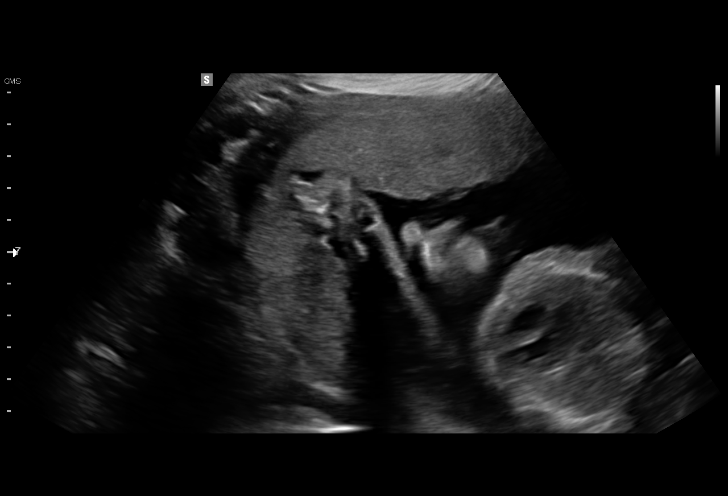
[im 26/76]
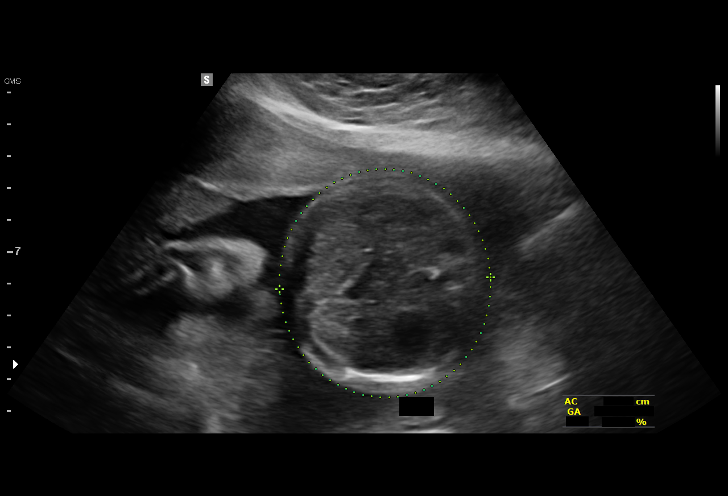
[im 31/76]
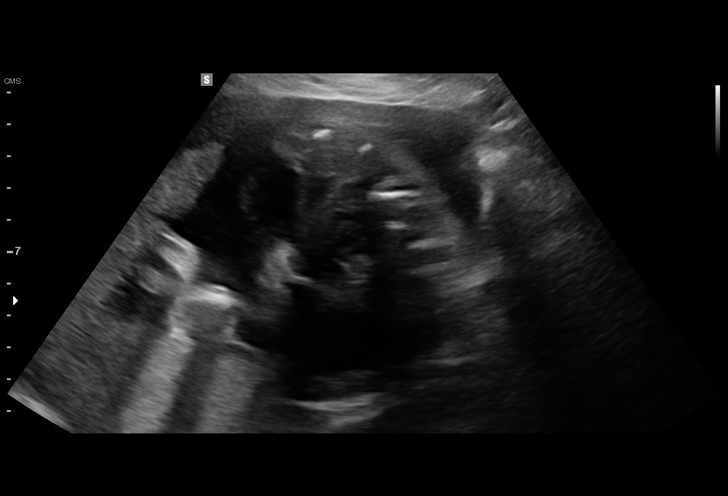
[im 39/76]
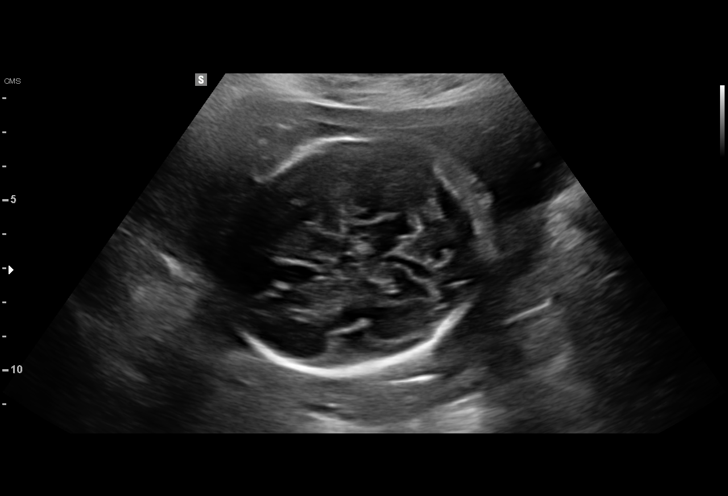
[im 45/76]
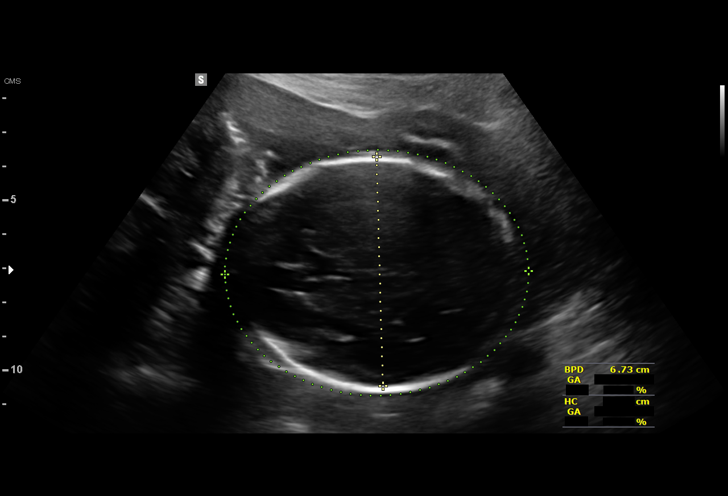
[im 51/76]
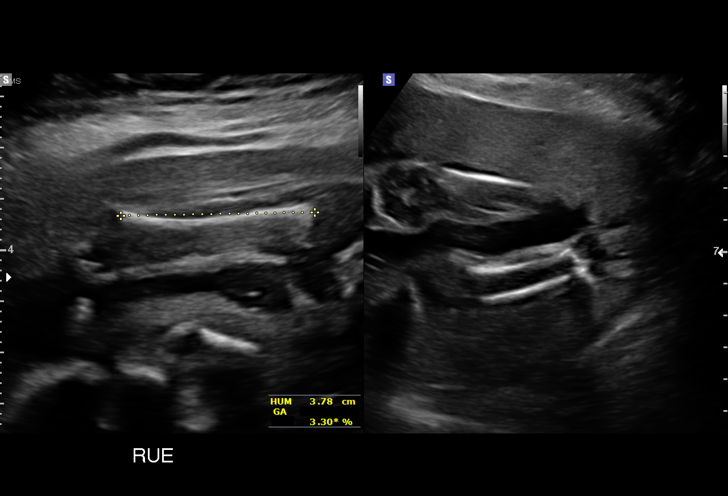
[im 56/76]
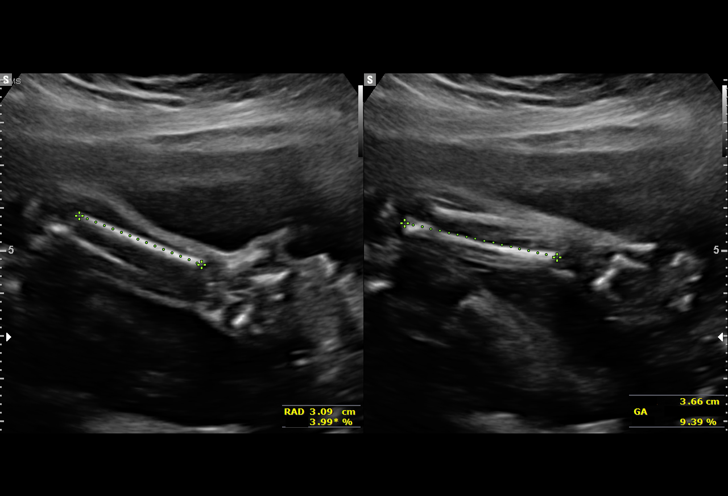
[im 62/76]
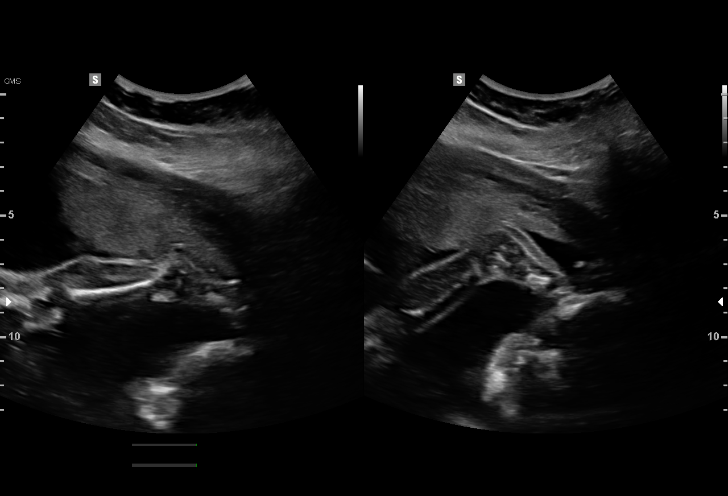
[im 67/76]
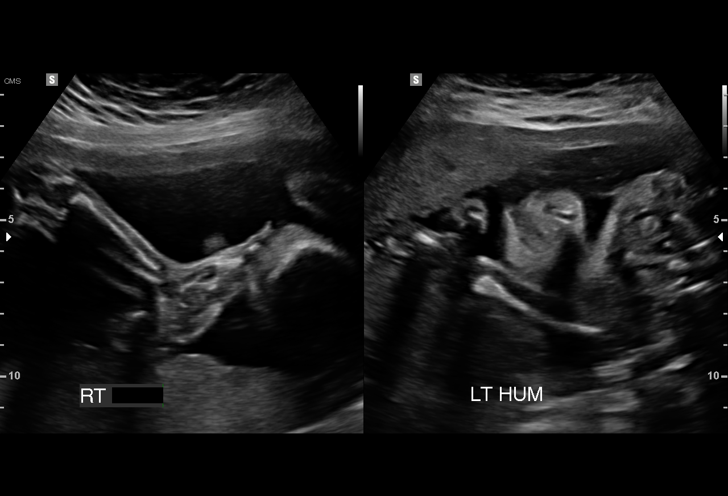
[im 73/76]
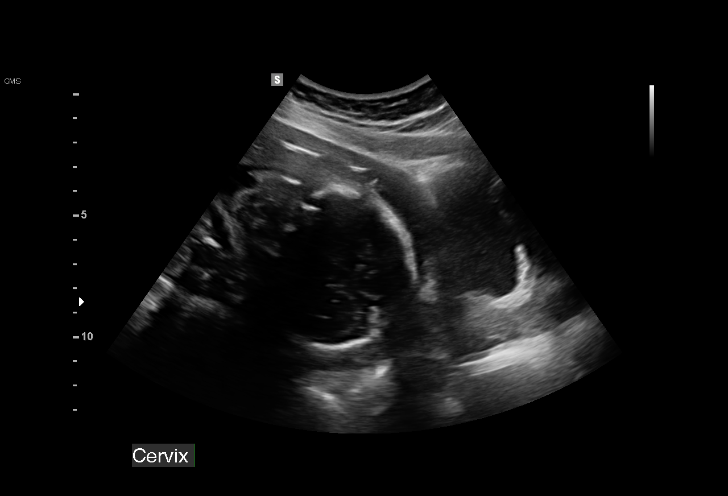

[13 of 28 positions shown; findings below may reference images not displayed]

[facility]
[[facility]

1  ALLEGRA STOCKER             [id number]       [id number]     [id number]
CHIKANE
Indications

26 weeks gestation of pregnancy
Detailed fetal anatomic survey                 Z36
Fetal abnormality - other known or
suspected (i.e. choriod plexus cyst, EIF,
renal pyelectasis)
OB History

Gravidity:    1         Term:   0        Prem:   0        SAB:   0
TOP:          0       Ectopic:  0        Living: 0
Fetal Evaluation

Num Of Fetuses:     1
Fetal Heart         144
Rate(bpm):
Cardiac Activity:   Observed
Presentation:       Breech
Placenta:           Right lateral, above cervical os
P. Cord Insertion:  Not well visualized

Amniotic Fluid
AFI FV:      Subjectively within normal limits
Larg Pckt:   5.51   cm
Biometry
BPD:      67.6  mm     G. Age:  27w 2d                  CI:        69.93   %   70 - 86
FL/HC:      15.4   %   18.6 -
HC:      257.9  mm     G. Age:  28w 0d         77  %    HC/AC:      1.19       1.04 -
AC:      216.8  mm     G. Age:  26w 1d         34  %    FL/BPD:     58.6   %   71 - 87
FL:       39.6  mm     G. Age:  22w 5d        < 3  %    FL/AC:      18.3   %   20 - 24
HUM:        38  mm     G. Age:  23w 3d        < 5  %
CER:      32.7  mm     G. Age:  28w 5d         86  %
CM:        5.8  mm

RIGHT
ULN:      36.2  mm     G. Age:  24w 1d        < 5  %
TIB:      34.3  mm     G. Age:  22w 5d        < 5  %
RAD:      31.2  mm     G. Age:  22w 1d        < 5  %
FIB:      34.8  mm     G. Age:  22w 6d         15  %
LEFT

Est. FW:     778  gm    1 lb 11 oz      25  %
Gestational Age

LMP:           27w 1d       Date:   06/03/15                 EDD:   03/09/16
U/S Today:     26w 0d                                        EDD:   03/17/16
Best:          26w 3d    Det. By:   Early Ultrasound         EDD:   03/14/16
Anatomy

Cranium:          Appears normal         Aortic Arch:      Appears normal
Fetal Cavum:      Appears normal         Ductal Arch:      Appears normal
Ventricles:       Appears normal         Diaphragm:        Appears normal
Choroid Plexus:   Not well visualized    Stomach:          Appears normal, left
sided
Cerebellum:       Appears normal         Abdomen:          Appears normal
Posterior Fossa:  Appears normal         Abdominal Wall:   Not well visualized
Nuchal Fold:      Not applicable (>20    Cord Vessels:     Appears normal (3
wks GA)                                  vessel cord)
Face:             Appears normal         Kidneys:          Appear normal
(orbits and profile)
Lips:             Appears normal         Bladder:          Appears normal
Heart:            Not well visualized    Spine:            Appears normal
RVOT:             Not well visualized    Upper             LUE not well
Extremities:      visualized
LVOT:             Appears normal         Lower             Appears normal
Extremities:

Other:  Fetus appears to be a male. Nasal bone visualized. Technically
difficult due to maternal habitus and fetal position.
Cervix Uterus Adnexa

Cervix
Length:            3.3  cm.
Normal appearance by transabdominal scan.

Uterus
No abnormality visualized.

Left Ovary
Not visualized.
Right Ovary
Within normal limits.

Cul De Sac:   No free fluid seen.

Adnexa:       No abnormality visualized.
Impression

SIUP at 02w2d
EFW 25th%
long bones are apparently normally ossified without bowing
or evidence of fractures but measure short for gestational age
cranial contour appears normal
profile is without apparent bossing
no family history of dwarfism or skeletal dysplasia
normal maternal stature
Recommendations

1. patient was offered and accepted genetic counseling
2. interval growth in 4 weeks
3. postnatal evaluation by pediatrics as deemed appropriate,
noting this is likely a normal variation of biometry but requires
the aforementioned discussion and antenatal growth
surveillance
4. given incomplete survey of anatomy, follow up attempts to
be made during subsequent evaluations

## 2018-06-30 LAB — OB RESULTS CONSOLE ABO/RH: RH Type: POSITIVE

## 2018-06-30 LAB — OB RESULTS CONSOLE RPR: RPR: NONREACTIVE

## 2018-06-30 LAB — OB RESULTS CONSOLE HIV ANTIBODY (ROUTINE TESTING): HIV: NONREACTIVE

## 2018-06-30 LAB — OB RESULTS CONSOLE HEPATITIS B SURFACE ANTIGEN: Hepatitis B Surface Ag: NEGATIVE

## 2018-06-30 LAB — OB RESULTS CONSOLE ANTIBODY SCREEN: Antibody Screen: NEGATIVE

## 2018-06-30 LAB — OB RESULTS CONSOLE RUBELLA ANTIBODY, IGM: Rubella: IMMUNE

## 2018-06-30 LAB — OB RESULTS CONSOLE GC/CHLAMYDIA
Chlamydia: NEGATIVE
Gonorrhea: NEGATIVE

## 2018-12-28 LAB — OB RESULTS CONSOLE GBS: GBS: NEGATIVE

## 2019-01-11 ENCOUNTER — Encounter (HOSPITAL_COMMUNITY): Payer: Self-pay | Admitting: *Deleted

## 2019-01-11 NOTE — Patient Instructions (Signed)
Jennifer [name]  01/11/2019   Your procedure is scheduled on:  01/16/2019  Arrive at 0730 at [employer] [facility] on [address] at [facility]  and [employer]. You are invited to use the FREE valet parking or use the Visitor's parking deck.  Pick up the phone at the desk and dial [phone].  Call this number if you have problems the morning of surgery: [phone]  Remember:   Do not eat food:(After Midnight) Desps de medianoche.  Do not drink clear liquids: (After Midnight) Desps de medianoche.  Take these medicines the morning of surgery with A SIP OF WATER:  May take protonix and carafate the morning of surgery   Do not wear jewelry, make-up or nail polish.  Do not wear lotions, powders, or perfumes. Do not wear deodorant.  Do not shave 48 hours prior to surgery.  Do not bring valuables to the hospital.  [facility] is not   responsible for any belongings or valuables brought to the hospital.  Contacts, dentures or bridgework may not be worn into surgery.  Leave suitcase in the car. After surgery it may be brought to your room.  For patients admitted to the hospital, checkout time is 11:00 AM the day of              discharge.      Please read over the following fact sheets that you were given:     Preparing for Surgery

## 2019-01-12 ENCOUNTER — Other Ambulatory Visit (HOSPITAL_COMMUNITY)
Admission: RE | Admit: 2019-01-12 | Discharge: 2019-01-12 | Disposition: A | Discharge status: Home / Self Care | Payer: BLUE CROSS/BLUE SHIELD | Source: Ambulatory Visit | Attending: Obstetrics and Gynecology | Admitting: Obstetrics and Gynecology

## 2019-01-12 ENCOUNTER — Other Ambulatory Visit: Payer: Self-pay

## 2019-01-12 DIAGNOSIS — Z1159 Encounter for screening for other viral diseases: Secondary | ICD-10-CM | POA: Insufficient documentation

## 2019-01-12 NOTE — MAU Note (Signed)
Pt screening complete, tolerated well, pt asymptomatic

## 2019-01-13 ENCOUNTER — Inpatient Hospital Stay (HOSPITAL_COMMUNITY)
Admission: RE | Admit: 2019-01-13 | Discharge: 2019-01-13 | Disposition: A | Discharge status: Home / Self Care | Payer: BLUE CROSS/BLUE SHIELD | Source: Ambulatory Visit

## 2019-01-13 LAB — NOVEL CORONAVIRUS, NAA (HOSP ORDER, SEND-OUT TO REF LAB; TAT 18-24 HRS): SARS-CoV-2, NAA: NOT DETECTED

## 2019-01-15 ENCOUNTER — Encounter (HOSPITAL_COMMUNITY): Payer: Self-pay | Admitting: Anesthesiology

## 2019-01-15 NOTE — H&P (Signed)
Jennifer [name] is a 33 y.o. female, G3 P0111, EGA [redacted] weeks with EDC 5-18 presenting for repeat c-section.  Last pregnancy had LTCS at 36 weeks for IUGR, oligo, cholestasis.  CTOL has not gone into labor so would prefer c-section.  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  OB History    Gravida  2   Para  1   Term      Preterm  1   AB      Living  1     SAB      TAB      Ectopic      Multiple  0   Live Births  1          Past Medical History:  Diagnosis Date  . Asthma    Allergy-induced  . Bronchitis   . Cholestasis   . Fibromyalgia   . IUGR (intrauterine growth restriction) 02/18/2016  . S/P cesarean section 02/18/2016   Past Surgical History:  Procedure Laterality Date  . CESAREAN SECTION N/A 02/17/2016   Procedure: CESAREAN SECTION;  Surgeon: Sherian Rein, MD;  Location: [facility];  Service: Obstetrics;  Laterality: N/A;  . KNEE SURGERY    . WISDOM TOOTH EXTRACTION     Family History: family history includes Diabetes in her father, maternal grandfather, mother, paternal grandfather, paternal grandmother, and another family member; Thyroid disease in her father and mother. Social History:  reports that she has never smoked. She has never used smokeless tobacco. She reports that she does not drink alcohol or use drugs.     Maternal Diabetes: No Genetic Screening: Normal Maternal Ultrasounds/Referrals: Normal Fetal Ultrasounds or other Referrals:  None Maternal Substance Abuse:  No Significant Maternal Medications:  None Significant Maternal Lab Results:  Lab values include: Group B Strep negative Other Comments:  short long bones on ultrasound, low-risk Panorama  Review of Systems  Respiratory: Negative.   Cardiovascular: Negative.    Maternal Medical History:  Fetal activity: Perceived fetal activity is normal.    Prenatal complications: no prenatal  complications Prenatal Complications - Diabetes: none.      Last menstrual period 04/18/2018, unknown if currently breastfeeding. Maternal Exam:  Abdomen: Patient reports no abdominal tenderness. Surgical scars: low transverse.   Estimated fetal weight is 7.5 lbs.   Fetal presentation: vertex  Introitus: Normal vulva. Normal vagina.    Physical Exam  Constitutional: She appears well-developed and well-nourished.  Neck: Neck supple. Thyromegaly present.  Cardiovascular: Normal rate, regular rhythm and normal heart sounds.  No murmur heard. Respiratory: Effort normal and breath sounds normal. No respiratory distress. She has no wheezes.  GI: Soft.  Genitourinary:    Vulva normal.     Prenatal labs: ABO, Rh: O/Positive/-- (10/24 0000) Antibody: Negative (10/24 0000) Rubella: Immune (10/24 0000) RPR: Nonreactive (10/24 0000)  HBsAg: Negative (10/24 0000)  HIV: Non-reactive (10/24 0000)  GBS: Negative (04/22 0000)   Assessment/Plan: IUP at 39 weeks, previous LTCS for repeat c-section.  Procedure and risks discussed, will admit for repeat c-section.   [name] 01/15/2019, 9:06 AM

## 2019-01-15 NOTE — Anesthesia Preprocedure Evaluation (Addendum)
Anesthesia Evaluation  Patient identified by MRN, date of birth, ID band Patient awake    Reviewed: Allergy & Precautions, NPO status , Patient's Chart, lab work & pertinent test results  Airway Mallampati: III  TM Distance: >3 FB Neck ROM: Full    Dental no notable dental hx. (+) Teeth Intact   Pulmonary asthma ,    Pulmonary exam normal breath sounds clear to auscultation       Cardiovascular negative cardio ROS Normal cardiovascular exam Rhythm:Regular Rate:Normal     Neuro/Psych  Neuromuscular disease negative psych ROS   GI/Hepatic Neg liver ROS, GERD  Medicated,  Endo/Other  Morbid obesity  Renal/GU negative Renal ROS  negative genitourinary   Musculoskeletal  (+) Fibromyalgia -  Abdominal (+) + obese,   Peds  Hematology negative hematology ROS (+)   Anesthesia Other Findings   Reproductive/Obstetrics (+) Pregnancy Previous C/Section x 1                           Anesthesia Physical Anesthesia Plan  ASA: III  Anesthesia Plan: Spinal   Post-op Pain Management:    Induction:   PONV Risk Score and Plan: 4 or greater and Ondansetron, Treatment may vary due to age or medical condition and Scopolamine patch - Pre-op  Airway Management Planned: Natural Airway  Additional Equipment:   Intra-op Plan:   Post-operative Plan:   Informed Consent: I have reviewed the patients History and Physical, chart, labs and discussed the procedure including the risks, benefits and alternatives for the proposed anesthesia with the patient or authorized representative who has indicated his/her understanding and acceptance.     Dental advisory given  Plan Discussed with: CRNA and Surgeon  Anesthesia Plan Comments:        Anesthesia Quick Evaluation

## 2019-01-16 ENCOUNTER — Inpatient Hospital Stay (HOSPITAL_COMMUNITY): Payer: BLUE CROSS/BLUE SHIELD | Admitting: Anesthesiology

## 2019-01-16 ENCOUNTER — Encounter (HOSPITAL_COMMUNITY)
Admission: AD | Disposition: A | Discharge status: Home / Self Care | Payer: Self-pay | Source: Home / Self Care | Attending: Obstetrics and Gynecology

## 2019-01-16 ENCOUNTER — Encounter (HOSPITAL_COMMUNITY): Payer: Self-pay | Admitting: *Deleted

## 2019-01-16 ENCOUNTER — Inpatient Hospital Stay (HOSPITAL_COMMUNITY)
Admission: AD | Admit: 2019-01-16 | Discharge: 2019-01-18 | DRG: 788 | Disposition: A | Discharge status: Home / Self Care | Payer: BLUE CROSS/BLUE SHIELD | Attending: Obstetrics and Gynecology | Admitting: Obstetrics and Gynecology

## 2019-01-16 ENCOUNTER — Other Ambulatory Visit: Payer: Self-pay

## 2019-01-16 DIAGNOSIS — O9962 Diseases of the digestive system complicating childbirth: Secondary | ICD-10-CM | POA: Diagnosis present

## 2019-01-16 DIAGNOSIS — Z3A39 39 weeks gestation of pregnancy: Secondary | ICD-10-CM

## 2019-01-16 DIAGNOSIS — K219 Gastro-esophageal reflux disease without esophagitis: Secondary | ICD-10-CM | POA: Diagnosis present

## 2019-01-16 DIAGNOSIS — O34211 Maternal care for low transverse scar from previous cesarean delivery: Principal | ICD-10-CM | POA: Diagnosis present

## 2019-01-16 DIAGNOSIS — Z98891 History of uterine scar from previous surgery: Secondary | ICD-10-CM

## 2019-01-16 DIAGNOSIS — O99214 Obesity complicating childbirth: Secondary | ICD-10-CM | POA: Diagnosis present

## 2019-01-16 LAB — TYPE AND SCREEN
ABO/RH(D): O POS
Antibody Screen: NEGATIVE

## 2019-01-16 LAB — CBC
HCT: 44.7 % (ref 36.0–46.0)
Hemoglobin: 15.2 g/dL — ABNORMAL HIGH (ref 12.0–15.0)
MCH: 30 pg (ref 26.0–34.0)
MCHC: 34 g/dL (ref 30.0–36.0)
MCV: 88.2 fL (ref 80.0–100.0)
Platelets: 215 10*3/uL (ref 150–400)
RBC: 5.07 MIL/uL (ref 3.87–5.11)
RDW: 14.4 % (ref 11.5–15.5)
WBC: 11 10*3/uL — ABNORMAL HIGH (ref 4.0–10.5)
nRBC: 0 % (ref 0.0–0.2)

## 2019-01-16 LAB — RPR: RPR Ser Ql: NONREACTIVE

## 2019-01-16 SURGERY — Surgical Case
Anesthesia: Spinal

## 2019-01-16 MED ORDER — NALBUPHINE HCL 10 MG/ML IJ SOLN: 5.0000 mg | INTRAMUSCULAR | Status: DC | PRN

## 2019-01-16 MED ORDER — MORPHINE SULFATE (PF) 0.5 MG/ML IJ SOLN
INTRAMUSCULAR | Status: AC
  Filled 2019-01-16: qty 10

## 2019-01-16 MED ORDER — CEFAZOLIN SODIUM-DEXTROSE 2-4 GM/100ML-% IV SOLN: 2.0000 g | INTRAVENOUS | Status: DC

## 2019-01-16 MED ORDER — ONDANSETRON HCL 4 MG/2ML IJ SOLN
INTRAMUSCULAR | Status: DC | PRN
  Administered 2019-01-16: 4 mg via INTRAVENOUS

## 2019-01-16 MED ORDER — DIPHENHYDRAMINE HCL 25 MG PO CAPS
25.0000 mg | ORAL_CAPSULE | ORAL | Status: DC | PRN
  Administered 2019-01-16: 25 mg via ORAL
  Filled 2019-01-16: qty 1

## 2019-01-16 MED ORDER — LACTATED RINGERS IV SOLN
INTRAVENOUS | Status: DC
  Administered 2019-01-16: 21:00:00 via INTRAVENOUS

## 2019-01-16 MED ORDER — KETOROLAC TROMETHAMINE 30 MG/ML IJ SOLN
30.0000 mg | Freq: Four times a day (QID) | INTRAMUSCULAR | Status: AC
  Administered 2019-01-16: 30 mg via INTRAVENOUS
  Filled 2019-01-16 (×2): qty 1

## 2019-01-16 MED ORDER — MEASLES, MUMPS & RUBELLA VAC IJ SOLR: 0.5000 mL | Freq: Once | INTRAMUSCULAR | Status: DC

## 2019-01-16 MED ORDER — LACTATED RINGERS IV SOLN
INTRAVENOUS | Status: DC | PRN
  Administered 2019-01-16 (×2): via INTRAVENOUS

## 2019-01-16 MED ORDER — MEPERIDINE HCL 25 MG/ML IJ SOLN: 6.2500 mg | INTRAMUSCULAR | Status: DC | PRN

## 2019-01-16 MED ORDER — FENTANYL CITRATE (PF) 100 MCG/2ML IJ SOLN
INTRAMUSCULAR | Status: AC
  Filled 2019-01-16: qty 2

## 2019-01-16 MED ORDER — DIBUCAINE (PERIANAL) 1 % EX OINT: 1.0000 "application " | TOPICAL_OINTMENT | CUTANEOUS | Status: DC | PRN

## 2019-01-16 MED ORDER — NALOXONE HCL 0.4 MG/ML IJ SOLN: 0.4000 mg | INTRAMUSCULAR | Status: DC | PRN

## 2019-01-16 MED ORDER — SODIUM CHLORIDE 0.9 % IV SOLN
INTRAVENOUS | Status: DC | PRN
  Administered 2019-01-16: 40 [IU] via INTRAVENOUS

## 2019-01-16 MED ORDER — DIPHENHYDRAMINE HCL 50 MG/ML IJ SOLN: 12.5000 mg | INTRAMUSCULAR | Status: DC | PRN

## 2019-01-16 MED ORDER — NALBUPHINE HCL 10 MG/ML IJ SOLN: 5.0000 mg | Freq: Once | INTRAMUSCULAR | Status: DC | PRN

## 2019-01-16 MED ORDER — METHYLERGONOVINE MALEATE 0.2 MG/ML IJ SOLN: 0.2000 mg | INTRAMUSCULAR | Status: DC | PRN

## 2019-01-16 MED ORDER — SIMETHICONE 80 MG PO CHEW
80.0000 mg | CHEWABLE_TABLET | ORAL | Status: DC | PRN
  Administered 2019-01-16 – 2019-01-17 (×2): 80 mg via ORAL
  Filled 2019-01-16 (×2): qty 1

## 2019-01-16 MED ORDER — ONDANSETRON HCL 4 MG/2ML IJ SOLN
INTRAMUSCULAR | Status: AC
  Filled 2019-01-16: qty 2

## 2019-01-16 MED ORDER — PRENATAL MULTIVITAMIN CH
1.0000 | ORAL_TABLET | Freq: Every day | ORAL | Status: DC
  Administered 2019-01-17 – 2019-01-18 (×2): 1 via ORAL
  Filled 2019-01-16 (×2): qty 1

## 2019-01-16 MED ORDER — ONDANSETRON HCL 4 MG/2ML IJ SOLN: 4.0000 mg | Freq: Three times a day (TID) | INTRAMUSCULAR | Status: DC | PRN

## 2019-01-16 MED ORDER — PHENYLEPHRINE HCL-NACL 20-0.9 MG/250ML-% IV SOLN
INTRAVENOUS | Status: AC
  Filled 2019-01-16: qty 250

## 2019-01-16 MED ORDER — SODIUM CHLORIDE 0.9% FLUSH: 3.0000 mL | INTRAVENOUS | Status: DC | PRN

## 2019-01-16 MED ORDER — KETOROLAC TROMETHAMINE 30 MG/ML IJ SOLN: 30.0000 mg | Freq: Four times a day (QID) | INTRAMUSCULAR | Status: AC | PRN

## 2019-01-16 MED ORDER — OXYTOCIN 40 UNITS IN NORMAL SALINE INFUSION - SIMPLE MED: 2.5000 [IU]/h | INTRAVENOUS | Status: AC

## 2019-01-16 MED ORDER — SODIUM CHLORIDE 0.9 % IR SOLN
Status: DC | PRN
  Administered 2019-01-16: 1

## 2019-01-16 MED ORDER — DIPHENHYDRAMINE HCL 25 MG PO CAPS: 25.0000 mg | ORAL_CAPSULE | Freq: Four times a day (QID) | ORAL | Status: DC | PRN

## 2019-01-16 MED ORDER — METHYLERGONOVINE MALEATE 0.2 MG PO TABS: 0.2000 mg | ORAL_TABLET | ORAL | Status: DC | PRN

## 2019-01-16 MED ORDER — ACETAMINOPHEN 500 MG PO TABS
1000.0000 mg | ORAL_TABLET | Freq: Four times a day (QID) | ORAL | Status: DC
  Administered 2019-01-16 – 2019-01-18 (×8): 1000 mg via ORAL
  Filled 2019-01-16 (×8): qty 2

## 2019-01-16 MED ORDER — TETANUS-DIPHTH-ACELL PERTUSSIS 5-2.5-18.5 LF-MCG/0.5 IM SUSP: 0.5000 mL | Freq: Once | INTRAMUSCULAR | Status: DC

## 2019-01-16 MED ORDER — FENTANYL CITRATE (PF) 100 MCG/2ML IJ SOLN: 25.0000 ug | INTRAMUSCULAR | Status: DC | PRN

## 2019-01-16 MED ORDER — OXYCODONE HCL 5 MG PO TABS
5.0000 mg | ORAL_TABLET | ORAL | Status: DC | PRN
  Administered 2019-01-17: 5 mg via ORAL
  Filled 2019-01-16: qty 1

## 2019-01-16 MED ORDER — ZOLPIDEM TARTRATE 5 MG PO TABS: 5.0000 mg | ORAL_TABLET | Freq: Every evening | ORAL | Status: DC | PRN

## 2019-01-16 MED ORDER — SODIUM CHLORIDE 0.9 % IV SOLN
INTRAVENOUS | Status: DC | PRN
  Administered 2019-01-16: 10:00:00 via INTRAVENOUS

## 2019-01-16 MED ORDER — WITCH HAZEL-GLYCERIN EX PADS: 1.0000 "application " | MEDICATED_PAD | CUTANEOUS | Status: DC | PRN

## 2019-01-16 MED ORDER — PHENYLEPHRINE HCL-NACL 20-0.9 MG/250ML-% IV SOLN
INTRAVENOUS | Status: DC | PRN
  Administered 2019-01-16: 60 ug/min via INTRAVENOUS

## 2019-01-16 MED ORDER — BUPIVACAINE IN DEXTROSE 0.75-8.25 % IT SOLN
INTRATHECAL | Status: DC | PRN
  Administered 2019-01-16: 1.7 mL via INTRATHECAL

## 2019-01-16 MED ORDER — COCONUT OIL OIL
1.0000 "application " | TOPICAL_OIL | Status: DC | PRN
  Administered 2019-01-17: 1 via TOPICAL

## 2019-01-16 MED ORDER — NALOXONE HCL 4 MG/10ML IJ SOLN
1.0000 ug/kg/h | INTRAVENOUS | Status: DC | PRN
  Filled 2019-01-16: qty 5

## 2019-01-16 MED ORDER — IBUPROFEN 800 MG PO TABS
800.0000 mg | ORAL_TABLET | Freq: Four times a day (QID) | ORAL | Status: DC
  Administered 2019-01-17 – 2019-01-18 (×5): 800 mg via ORAL
  Filled 2019-01-16 (×5): qty 1

## 2019-01-16 MED ORDER — FENTANYL CITRATE (PF) 100 MCG/2ML IJ SOLN
INTRAMUSCULAR | Status: DC | PRN
  Administered 2019-01-16: 15 ug via INTRATHECAL

## 2019-01-16 MED ORDER — OXYTOCIN 40 UNITS IN NORMAL SALINE INFUSION - SIMPLE MED
INTRAVENOUS | Status: AC
  Filled 2019-01-16: qty 1000

## 2019-01-16 MED ORDER — MENTHOL 3 MG MT LOZG: 1.0000 | LOZENGE | OROMUCOSAL | Status: DC | PRN

## 2019-01-16 MED ORDER — MAGNESIUM HYDROXIDE 400 MG/5ML PO SUSP: 30.0000 mL | ORAL | Status: DC | PRN

## 2019-01-16 MED ORDER — SENNOSIDES-DOCUSATE SODIUM 8.6-50 MG PO TABS
2.0000 | ORAL_TABLET | ORAL | Status: DC
  Administered 2019-01-16 – 2019-01-17 (×2): 2 via ORAL
  Filled 2019-01-16 (×2): qty 2

## 2019-01-16 MED ORDER — MORPHINE SULFATE (PF) 0.5 MG/ML IJ SOLN
INTRAMUSCULAR | Status: DC | PRN
  Administered 2019-01-16: .15 mg via INTRATHECAL

## 2019-01-16 SURGICAL SUPPLY — 37 items
APL SKNCLS STERI-STRIP NONHPOA (GAUZE/BANDAGES/DRESSINGS) ×1
BENZOIN TINCTURE PRP APPL 2/3 (GAUZE/BANDAGES/DRESSINGS) ×2 IMPLANT
CHLORAPREP W/TINT 26ML (MISCELLANEOUS) ×3 IMPLANT
CLAMP CORD UMBIL (MISCELLANEOUS) IMPLANT
CLOSURE STERI-STRIP 1/2X4 (GAUZE/BANDAGES/DRESSINGS) ×1
CLOTH BEACON ORANGE TIMEOUT ST (SAFETY) ×3 IMPLANT
CLSR STERI-STRIP ANTIMIC 1/2X4 (GAUZE/BANDAGES/DRESSINGS) ×1 IMPLANT
DRSG OPSITE POSTOP 4X10 (GAUZE/BANDAGES/DRESSINGS) ×3 IMPLANT
ELECT REM PT RETURN 9FT ADLT (ELECTROSURGICAL) ×3
ELECTRODE REM PT RTRN 9FT ADLT (ELECTROSURGICAL) ×1 IMPLANT
EXTRACTOR VACUUM KIWI (MISCELLANEOUS) IMPLANT
EXTRACTOR VACUUM M CUP 4 TUBE (SUCTIONS) IMPLANT
EXTRACTOR VACUUM M CUP 4' TUBE (SUCTIONS)
GLOVE BIOGEL PI IND STRL 7.0 (GLOVE) ×1 IMPLANT
GLOVE BIOGEL PI INDICATOR 7.0 (GLOVE) ×2
GLOVE ORTHO TXT STRL SZ7.5 (GLOVE) ×3 IMPLANT
GOWN STRL REUS W/TWL LRG LVL3 (GOWN DISPOSABLE) ×6 IMPLANT
KIT ABG SYR 3ML LUER SLIP (SYRINGE) IMPLANT
NDL HYPO 25X5/8 SAFETYGLIDE (NEEDLE) ×1 IMPLANT
NEEDLE HYPO 25X5/8 SAFETYGLIDE (NEEDLE) ×3 IMPLANT
NS IRRIG 1000ML POUR BTL (IV SOLUTION) ×3 IMPLANT
PACK C SECTION WH (CUSTOM PROCEDURE TRAY) ×3 IMPLANT
PAD OB MATERNITY 4.3X12.25 (PERSONAL CARE ITEMS) ×3 IMPLANT
PENCIL SMOKE EVAC W/HOLSTER (ELECTROSURGICAL) ×3 IMPLANT
RTRCTR C-SECT PINK 25CM LRG (MISCELLANEOUS) ×3 IMPLANT
SUT CHROMIC 1 CTX 36 (SUTURE) ×6 IMPLANT
SUT PLAIN 0 NONE (SUTURE) IMPLANT
SUT PLAIN 2 0 XLH (SUTURE) ×2 IMPLANT
SUT VIC AB 0 CT1 27 (SUTURE) ×6
SUT VIC AB 0 CT1 27XBRD ANBCTR (SUTURE) ×2 IMPLANT
SUT VIC AB 2-0 CT1 (SUTURE) ×3 IMPLANT
SUT VIC AB 2-0 CT1 27 (SUTURE) ×3
SUT VIC AB 2-0 CT1 TAPERPNT 27 (SUTURE) ×1 IMPLANT
SUT VIC AB 4-0 KS 27 (SUTURE) ×2 IMPLANT
TOWEL OR 17X24 6PK STRL BLUE (TOWEL DISPOSABLE) ×3 IMPLANT
TRAY FOLEY W/BAG SLVR 14FR LF (SET/KITS/TRAYS/PACK) ×3 IMPLANT
WATER STERILE IRR 1000ML POUR (IV SOLUTION) ×3 IMPLANT

## 2019-01-16 NOTE — Transfer of Care (Signed)
Immediate Anesthesia Transfer of Care Note  Patient: Jennifer [name]  Procedure(s) Performed: CESAREAN SECTION (N/A )  Patient Location: PACU  Anesthesia Type:Spinal  Level of Consciousness: awake, alert  and oriented  Airway & Oxygen Therapy: Patient Spontanous Breathing  Post-op Assessment: Report given to RN and Post -op Vital signs reviewed and stable  Post vital signs: Reviewed and stable  Last Vitals:  Vitals Value Taken Time  BP    Temp    Pulse    Resp    SpO2      Last Pain:  Vitals:   01/16/19 0759  TempSrc: Oral  PainSc: 0-No pain         Complications: No apparent anesthesia complications

## 2019-01-16 NOTE — Interval H&P Note (Signed)
History and Physical Interval Note:  01/16/2019 9:33 AM  Jennifer [name]  has presented today for surgery, with the diagnosis of repeat C-Section at 40wks.  The various methods of treatment have been discussed with the patient and family. After consideration of risks, benefits and other options for treatment, the patient has consented to  Procedure(s) with comments: CESAREAN SECTION (N/A) - [name],  RNFA as a surgical intervention.  The patient's history has been reviewed, patient examined, no change in status, stable for surgery.  I have reviewed the patient's chart and labs.  Questions were answered to the patient's satisfaction.     Leighton Roach Jazmine Longshore

## 2019-01-16 NOTE — Anesthesia Procedure Notes (Signed)
Spinal  Patient location during procedure: OR Start time: 01/16/2019 9:57 AM End time: 01/16/2019 10:01 AM Staffing Anesthesiologist: Mal Amabile, MD Performed: anesthesiologist  Preanesthetic Checklist Completed: patient identified, site marked, surgical consent, pre-op evaluation, timeout performed, IV checked, risks and benefits discussed and monitors and equipment checked Spinal Block Patient position: sitting Prep: site prepped and draped and DuraPrep Patient monitoring: heart rate, cardiac monitor, continuous pulse ox and blood pressure Approach: midline Location: L4-5 Injection technique: single-shot Needle Needle type: Pencan  Needle gauge: 24 G Needle length: 9 cm Needle insertion depth: 7 cm Assessment Sensory level: T4 Additional Notes Patient tolerated procedure well. Adequate sensory level.

## 2019-01-16 NOTE — Progress Notes (Signed)
Patient ID: Jennifer [name], female   DOB: 09-20-1985, 33 y.o.   MRN: [id number] POD#0 Pt doing well with no complaints. Pain well controlled. No HA, SOB, CP or fever. Tol PO. Voidng well per foley. Bonding well with baby Eli  VSS  GEN - NAD ABD - non distended; dressing in place EXT - +1 edema  A/P: POD#0 s/p repeat c/s - stable         Routine pp/post op care         Abdominal binder

## 2019-01-16 NOTE — Anesthesia Postprocedure Evaluation (Signed)
Anesthesia Post Note  Patient: Jennifer [name]  Procedure(s) Performed: CESAREAN SECTION (N/A )     Patient location during evaluation: PACU Anesthesia Type: Spinal Level of consciousness: oriented and awake and alert Pain management: pain level controlled Vital Signs Assessment: post-procedure vital signs reviewed and stable Respiratory status: spontaneous breathing, respiratory function stable and nonlabored ventilation Cardiovascular status: blood pressure returned to baseline and stable Postop Assessment: no headache, no backache, no apparent nausea or vomiting, patient able to bend at knees and spinal receding Anesthetic complications: no    Last Vitals:  Vitals:   01/16/19 1145 01/16/19 1200  BP: 120/75 114/72  Pulse: 77 74  Resp: 19 15  Temp:    SpO2: 99% 99%    Last Pain:  Vitals:   01/16/19 1145  TempSrc:   PainSc: 0-No pain   Pain Goal:    LLE Motor Response: Responds to commands (01/16/19 1200)   RLE Motor Response: Responds to commands (01/16/19 1200)       Epidural/Spinal Function Cutaneous sensation: Able to Discern Pressure (01/16/19 1200), Patient able to flex knees: No(able to wiggle both knees) (01/16/19 1200), Patient able to lift hips off bed: No (01/16/19 1200), Back pain beyond tenderness at insertion site: No (01/16/19 1200), Progressively worsening motor and/or sensory loss: No (01/16/19 1200), Bowel and/or bladder incontinence post epidural: No (01/16/19 1200)  Javarion Douty A.

## 2019-01-16 NOTE — Op Note (Signed)
Preoperative diagnosis: Intrauterine pregnancy at 39 weeks, previous c-section Postoperative diagnosis: Same Procedure: Repeat low transverse cesarean section without extensions Surgeon: Lavina Hamman M.D. Assistant:  Webb Silversmith, RNFA Anesthesia: Spinal  Findings: Patient had normal gravid anatomy and delivered a viable female infant with Apgars of 9 and 9 weight pending Estimated blood loss: 100 cc Specimens: Placenta sent to labor and delivery Complications: None  Procedure in detail: The patient was taken to the operating room and placed in the sitting position. Dr. Malen Gauze instilled spinal anesthesia.  She was then placed in the dorsosupine position with left tilt. Abdomen was then prepped and draped in the usual sterile fashion, and a foley catheter was inserted. The level of her anesthesia was found to be adequate. Abdomen was entered via a standard Pfannenstiel incision through her previous scar.  Some omental adhesions were taken down with bovie. Once the peritoneal cavity was entered the Alexis disposable self-retaining retractor was placed and good visualization was achieved. A 4 cm transverse incision was then made in the lower uterine segment pushing the bladder inferior. Once the uterine cavity was entered the incision was extended digitally. The fetal vertex was grasped and delivered through the incision atraumatically. Mouth and nares were suctioned. The remainder of the infant then delivered atraumatically. Cord was doubly clamped and cut and the infant handed to the awaiting pediatric team. Cord blood was obtained. The placenta delivered spontaneously. Uterus was wiped dry with clean lap pad and all clots and debris were removed. Uterine incision was inspected and found to be free of extensions. Uterine incision was closed in 1 layer with running locking #1 Chromic. Tubes and ovaries were inspected and found to be normal. Uterine incision was inspected and found to be hemostatic.  Bleeding from serosal edges was controlled with electrocautery. The Alexis retractor was removed. Subfascial space was irrigated and made hemostatic with electrocautery. Peritoneum was closed with 2-0 Vicryl.  Fascia was closed in running fashion starting at both ends and meeting in the middle with 0 Vicryl. Subcutaneous tissue was then irrigated and made hemostatic with electrocautery, then closed with running 2-0 plain gut. Skin was closed with running 4-0 Vicryl subcuticular suture followed by steri-strips and a sterile dressing. Patient tolerated the procedure well and was taken to the recovery in stable condition. Counts were correct x2, she received Ancef 2 g IV at the beginning of the procedure and she had PAS hose on throughout the procedure.

## 2019-01-16 NOTE — Lactation Note (Signed)
This note was copied from a baby's chart. Lactation Consultation Note  Patient Name: Jennifer [name] [id number] Date: 01/16/2019 Reason for consult: Initial assessment;Term;1st time breastfeeding  P2 mother whose infant is now 79 hours old.  Mother pumped and exclusively bottle fed her first child for 7 months.  Baby asleep in mother's arms when I arrived.  Mother stated he has fed 3 times since delivery and she has no questions/concerns at this time.  Mother's breasts are large, soft and non tender and nipples are everted and short shafted.  Taught hand expression and mother was able to express a drop of colostrum from the left breast.  Colostrum container provided and milk storage times reviewed.  Finger feeding demonstrated.  Encouraged to feed 8-12 times/24 hours or sooner if baby shows feeding cues.  Reviewed cues.  Mother will call for latch assistance as needed.  She has 3 DEBPs for home use.  Father present.   Maternal Data Formula Feeding for Exclusion: No Has patient been taught Hand Expression?: Yes Does the patient have breastfeeding experience prior to this delivery?: No(pumped and exclusively bottle fed for 7 months)  Feeding    LATCH Score                   Interventions    Lactation Tools Discussed/Used     Consult Status Consult Status: Follow-up Date: 01/17/19 Follow-up type: In-patient    Dora Sims 01/16/2019, 6:19 PM

## 2019-01-17 LAB — CBC
HCT: 39.4 % (ref 36.0–46.0)
Hemoglobin: 13.4 g/dL (ref 12.0–15.0)
MCH: 29.7 pg (ref 26.0–34.0)
MCHC: 34 g/dL (ref 30.0–36.0)
MCV: 87.4 fL (ref 80.0–100.0)
Platelets: 189 10*3/uL (ref 150–400)
RBC: 4.51 MIL/uL (ref 3.87–5.11)
RDW: 14.5 % (ref 11.5–15.5)
WBC: 12.9 10*3/uL — ABNORMAL HIGH (ref 4.0–10.5)
nRBC: 0 % (ref 0.0–0.2)

## 2019-01-17 LAB — ABO/RH: ABO/RH(D): O POS

## 2019-01-17 NOTE — Lactation Note (Signed)
This note was copied from a baby's chart. Lactation Consultation Note  Patient Name: Jennifer [name] [id number] Date: 01/17/2019 Reason for consult: Follow-up assessment(was called by the NT to see mom due to asking for a NS )  LC sized mom for a #24 NS at her request due to since [facility] consult this am she has had a difficult time  Latching.  Has been pumping, and asked the staff for formula.  #24 NS is appropriate fit for moms tissue. Mom holding baby so she was unable to demo back to [facility] .  The #24 NS is appropriate fit / LC recommended instilling EBM or formula into the top of the NS after  Application for an appetizer so the baby is more patient with latching.  LC also recommended since the left nipple / areola complex is going to take a while to latch due to edema.  She may find breast feeding on the 1st breast / supplementing according to guidelines provided with a artifical  Nipple PACE Feeding/ and post pump both breast.  LC praised mom for her efforts and pumping and reassured her this will take time.  LC also made mom aware the supplementing guidelines are guidelines and if the baby needs more,  Increase gradually if needed.  DEBP set up and hand pump, shells .    Maternal Data    Feeding Feeding Type: Breast Milk  LATCH Score                   Interventions Interventions: Breast feeding basics reviewed  Lactation Tools Discussed/Used Tools: Nipple Dorris Carnes;Shells;Pump;Flanges Nipple shield size: 24(LC sized mom and the #24 NS is a good fit / LC reviewed application ) Flange Size: 27 Shell Type: Inverted Breast pump type: Double-Electric Breast Pump   Consult Status Consult Status: Follow-up Date: 01/18/19 Follow-up type: In-patient    [name] 01/17/2019, 2:46 PM

## 2019-01-17 NOTE — Progress Notes (Signed)
Subjective: Postpartum Day 1: Cesarean Delivery Patient reports tolerating PO and no problems voiding.  Ambulating well.  Objective: Vital signs in last 24 hours: Temp:  [97.6 F (36.4 C)-98.6 F (37 C)] 98.4 F (36.9 C) (05/12 0522) Pulse Rate:  [65-89] 79 (05/12 0522) Resp:  [13-19] 18 (05/12 0522) BP: (100-120)/(55-90) 113/72 (05/12 0522) SpO2:  [95 %-100 %] 95 % (05/12 0522)  Physical Exam:  General: alert and cooperative Lochia: appropriate Uterine Fundus: firm Incision:C/D/I   Recent Labs    01/16/19 0816 01/17/19 0652  HGB 15.2* 13.4  HCT 44.7 39.4    Assessment/Plan: Status post Cesarean section. Doing well postoperatively.  Continue current care. D/w pt circumcision and she desires to proceed but asks to wait until tomorrow since baby still not feeding well.  Jennifer [name] 01/17/2019, 10:19 AM

## 2019-01-17 NOTE — Lactation Note (Signed)
This note was copied from a baby's chart. Lactation Consultation Note  Patient Name: Jennifer [name] [id number] Date: 01/17/2019 Reason for consult: Follow-up assessment;Term;1st time breastfeeding;Other (Comment);Difficult latch;Infant weight loss(1st baby was in NICU and pumped for 7 months only - DL on the left breast - see LC note )  Baby is 23 hours old  As LC entered the room mom holding baby and per mom had fed around 8 am for 10 mins on and off. ( left breast)  Dr. Chestine Spore into to exam baby/ baby more awake after exam/ LC changed a large wet diaper.  LC offered to assist to latch/ check the left breast and the areola tough, swollen, semi compressible and to  Large for the baby's mouth.  Switch to the right breast and and the areola more compressible. Baby awake and rooting. After a few attempts  Baby latched and fed 8 mins with several swallows/ increased with breast compressions.  LC reviewed hand  expressing after feeding and several drops noted. Baby satisfied after feeding.   LC reassured mom latching takes time and that the baby is still in the 1st 24 hours of life.   LC plan:  Shells between feedings except when sleeping Prior to latching - breast massage, hand express, pre-pump with hand pump and reverse pressure if needed.  Latch STS with firm support ( 3 pillows ) - football position  for now  Work with baby to open wide and latch - showed dad how he can assist to obtain depth.  After feeding - post pump both breast for 15 - 20 mins .   Note -  the left nipple / areola needs needs work for the baby to latch with shells / coconut oil and pumping.    LC gave report of consult findings to [facility] - and asked for her to provide coconut oil to mom for pumping.    Mom aware she can call for assistance.   Maternal Data Has patient been taught Hand Expression?: Yes(LC reviewed several drops of colostrum noted ) Does the patient have breastfeeding experience prior to  this delivery?: No  Feeding Feeding Type: (per mom the baby last fed at around 8 amd for 10 mins on and off / left breast )  LATCH Score Latch: Repeated attempts needed to sustain latch, nipple held in mouth throughout feeding, stimulation needed to elicit sucking reflex.  Audible Swallowing: Spontaneous and intermittent  Type of Nipple: Everted at rest and after stimulation  Comfort (Breast/Nipple): Soft / non-tender  Hold (Positioning): Assistance needed to correctly position infant at breast and maintain latch.  LATCH Score: 8  Interventions Interventions: Breast feeding basics reviewed  Lactation Tools Discussed/Used Tools: Shells;Pump;Flanges Flange Size: 24 Shell Type: Inverted Breast pump type: Double-Electric Breast Pump;Manual WIC Program: No   Consult Status Consult Status: Follow-up Date: 01/17/19 Follow-up type: In-patient    Matilde Sprang Agron Swiney 01/17/2019, 9:34 AM

## 2019-01-18 LAB — BIRTH TISSUE RECOVERY COLLECTION (PLACENTA DONATION)

## 2019-01-18 MED ORDER — PRENATAL MULTIVITAMIN CH: 1.0000 | ORAL_TABLET | Freq: Every day | ORAL | 3 refills | Status: DC

## 2019-01-18 MED ORDER — IBUPROFEN 800 MG PO TABS: 800.0000 mg | ORAL_TABLET | Freq: Three times a day (TID) | ORAL | 0 refills | Status: DC | PRN

## 2019-01-18 MED ORDER — OXYCODONE HCL 5 MG PO TABS: 5.0000 mg | ORAL_TABLET | Freq: Four times a day (QID) | ORAL | 0 refills | Status: DC | PRN

## 2019-01-18 NOTE — Progress Notes (Signed)
Subjective: Postpartum Day 2: Cesarean Delivery Patient reports incisional pain, tolerating PO and no problems voiding.    Objective: Vital signs in last 24 hours: Temp:  [97.4 F (36.3 C)-98.4 F (36.9 C)] 97.4 F (36.3 C) (05/13 0517) Pulse Rate:  [70-88] 70 (05/13 0517) Resp:  [18-20] 18 (05/13 0517) BP: (101-114)/(65-79) 113/67 (05/13 0517) SpO2:  [97 %-98 %] 98 % (05/12 1422)  Physical Exam:  General: alert and no distress Lochia: appropriate Uterine Fundus: firm Incision: healing well DVT Evaluation: No evidence of DVT seen on physical exam.  Recent Labs    01/16/19 0816 01/17/19 0652  HGB 15.2* 13.4  HCT 44.7 39.4    Assessment/Plan: Status post Cesarean section. Doing well postoperatively.  Discharge home with standard precautions and return to clinic in 2 and 6 weeks.  D/C with motrin, percocet and PNV.  F/u 2 and 6 weeks  Jennifer [name] 01/18/2019, 8:54 AM

## 2019-01-18 NOTE — Discharge Summary (Signed)
OB Discharge Summary     Patient Name: Jennifer [name] DOB: 17-Feb-1986 MRN: [id number]  Date of admission: 01/16/2019 Delivering MD: Jackelyn Knife, TODD   Date of discharge: 01/18/2019  Admitting diagnosis: repeat C-Section at 40wks Intrauterine pregnancy: [redacted]w[redacted]d     Secondary diagnosis:  Active Problems:   S/P cesarean section   History of low transverse cesarean section  Additional problems: N/A     Discharge diagnosis: Term Pregnancy Delivered                                                                                                Post partum procedures:N/A  Augmentation: N/A  Complications: None  Hospital course:  Sceduled C/S   33 y.o. yo [id number] at [redacted]w[redacted]d was admitted to the hospital 01/16/2019 for scheduled cesarean section with the following indication:Elective Repeat.  Membrane Rupture Time/Date: 10:20 AM ,01/16/2019   Patient delivered a Viable infant.01/16/2019  Details of operation can be found in separate operative note.  Pateint had an uncomplicated postpartum course.  She is ambulating, tolerating a regular diet, passing flatus, and urinating well. Patient is discharged home in stable condition on  01/18/19         Physical exam  Vitals:   01/17/19 1030 01/17/19 1422 01/17/19 2110 01/18/19 0517  BP: 107/68 114/79 101/65 113/67  Pulse: 78 88 78 70  Resp: 18 20 18 18   Temp: 98.4 F (36.9 C) 98.1 F (36.7 C) 98 F (36.7 C) (!) 97.4 F (36.3 C)  TempSrc: Oral Oral Oral Oral  SpO2: 97% 98%    Weight:      Height:       General: alert and no distress Lochia: appropriate Uterine Fundus: firm Incision: Healing well with no significant drainage DVT Evaluation: No evidence of DVT seen on physical exam. Labs: Lab Results  Component Value Date   WBC 12.9 (H) 01/17/2019   HGB 13.4 01/17/2019   HCT 39.4 01/17/2019   MCV 87.4 01/17/2019   PLT 189 01/17/2019   CMP Latest Ref Rng & Units 02/17/2016  Glucose 65 - 99 mg/dL 734(Y)  BUN 6 - 20 mg/dL 11   Creatinine 3.70 - 1.00 mg/dL 9.64  Sodium 383 - 818 mmol/L 134(L)  Potassium 3.5 - 5.1 mmol/L 3.8  Chloride 101 - 111 mmol/L 105  CO2 22 - 32 mmol/L 22  Calcium 8.9 - 10.3 mg/dL 4.0(R)  Total Protein 6.5 - 8.1 g/dL 6.0(L)  Total Bilirubin 0.3 - 1.2 mg/dL 0.6  Alkaline Phos 38 - 126 U/L 140(H)  AST 15 - 41 U/L 18  ALT 14 - 54 U/L 17    Discharge instruction: per After Visit Summary and "Baby and Me Booklet".  After visit meds:  Allergies as of 01/18/2019   No Known Allergies     Medication List    TAKE these medications   cetirizine 10 MG tablet Commonly known as:  ZYRTEC Take 10 mg by mouth daily.   docusate sodium 100 MG capsule Commonly known as:  COLACE Take 200 mg by mouth at bedtime as needed for mild constipation.   ibuprofen 800  MG tablet Commonly known as:  ADVIL Take 1 tablet (800 mg total) by mouth every 8 (eight) hours as needed for moderate pain.   Magnesium Oxide 250 MG Tabs Take 500 mg by mouth at bedtime.   oxyCODONE 5 MG immediate release tablet Commonly known as:  Oxy IR/ROXICODONE Take 1-2 tablets (5-10 mg total) by mouth every 6 (six) hours as needed for moderate pain or severe pain.   pantoprazole 40 MG tablet Commonly known as:  PROTONIX Take 40 mg by mouth 2 (two) times daily.   prenatal multivitamin Tabs tablet Take 1 tablet by mouth daily at 12 noon.   sucralfate 1 g tablet Commonly known as:  CARAFATE Take 1 g by mouth 4 (four) times daily.       Diet: routine diet  Activity: Advance as tolerated. Pelvic rest for 6 weeks.   Outpatient follow up:2 and 6 weeks Follow up Appt:No future appointments. Follow up Visit:No follow-ups on file.  Postpartum contraception: Undecided  Newborn Data: Live born female  Birth Weight: 6 lb 14.6 oz (3135 g) APGAR: 9, 10  Newborn Delivery   Birth date/time:  01/16/2019 10:23:00 Delivery type:  C-Section, Low Transverse Trial of labor:  No C-section categorization:  Repeat     Baby  Feeding: Bottle and Breast Disposition:home with mother   01/18/2019 Sherian ReinJody Bovard-Stuckert, MD

## 2019-01-18 NOTE — Lactation Note (Signed)
This note was copied from a baby's chart. Lactation Consultation Note  Patient Name: Boy Palmina Clodfelter [id number] Date: 01/18/2019 Reason for consult: Follow-up assessment;Term;Infant weight loss(7% weight loss, BIli check WNL, pumping and bottle feeding for now /  see [place] note )  Baby is 31 hours old,  Per mom the baby recently fed pumped milk and formula.  Per mom has been pumping every 2-3 hours and last night stretched it to 4 hours. Vo;ume  Of milk increasing.  LC reviewed supply and demand and recommended pumping both breast for 15- 20 mins at least  8-10 times day and at least once at night.  LC recommended to mom since its her 2nd baby to beware her volume can be greater and her let  Down can be quicker, she may need to set an alarm for 3 hours during the night so she doesn't wake  Up to engorged breast. Sore nipple and engorgement prevention and tx reviewed.  Per mom has Spectra DEBP at home. Mom aware there are 2 different size flanges.  LC reassured mom and recommended since the baby latched yesterday not to give up on the goal to  Breast feed. Mom expressed the NS she asked for yesterday just didn't work well.  LC mentioned it can be a different situation with latching once the milk comes in.  [facility] offered mom and LC O/P appt and mom receptive. LC made mom aware the [facility] )  Would call her and she was ok with it . [place] placed a request to set dyad up for [facility] O/P appt for next Monday or Tuesday  Of next week.  LC reminded mom to her #24 NS and pumping kit.     Maternal Data    Feeding Feeding Type: (baby was recently fed ) Nipple Type: Slow - flow  LATCH Score                   Interventions Interventions: Breast feeding basics reviewed  Lactation Tools Discussed/Used Tools: Pump;Shells Nipple shield size: (mom has #24 NS - per mom has not used it ) Flange Size: 24;27 Shell Type: Inverted Breast pump type: Double-Electric Breast  Pump Pump Review: Milk Storage   Consult Status Consult Status: Follow-up Date: ([place] offered to request the [facility] to call her for [facility] O/P appt for next Monday or Tuesday and mom receptive / aware she will receive a phone call ) Follow-up type: Out-patient(LC sent a request for [facility] clinic to call mom )    Myer Haff 01/18/2019, 10:10 AM

## 2019-01-19 ENCOUNTER — Other Ambulatory Visit (HOSPITAL_COMMUNITY): Payer: BLUE CROSS/BLUE SHIELD

## 2019-11-23 ENCOUNTER — Telehealth: Payer: Self-pay

## 2019-11-23 NOTE — Telephone Encounter (Signed)
Called the patient and left a voicemail for her to give the office a call back for a new patient appointment.

## 2020-08-23 ENCOUNTER — Ambulatory Visit (INDEPENDENT_AMBULATORY_CARE_PROVIDER_SITE_OTHER): Payer: BC Managed Care – PPO

## 2020-08-23 ENCOUNTER — Ambulatory Visit (INDEPENDENT_AMBULATORY_CARE_PROVIDER_SITE_OTHER): Payer: BC Managed Care – PPO | Admitting: Podiatry

## 2020-08-23 ENCOUNTER — Other Ambulatory Visit: Payer: Self-pay

## 2020-08-23 DIAGNOSIS — M722 Plantar fascial fibromatosis: Secondary | ICD-10-CM | POA: Diagnosis not present

## 2020-08-27 ENCOUNTER — Encounter: Payer: Self-pay | Admitting: Podiatry

## 2020-08-27 NOTE — Progress Notes (Signed)
Subjective:  Patient ID: Jennifer [name], female    DOB: 01-Dec-1985,  MRN: [id number]  Chief Complaint  Patient presents with  . Plantar Fasciitis    Bilateral x1 month heel and foot pain     34 y.o. female presents with the above complaint.  Patient presents with bilateral heel pain that has been going on for a month has progressive gotten worse.  Patient states his heel pain as well as arch pain.  Patient states the right side is much greater than left side.  She has not tried any treatment options she would like to discuss treatment options she has not seen anyone else prior to seeing me.  She denies any other acute complaints.   Review of Systems: Negative except as noted in the HPI. Denies N/V/F/Ch.  Past Medical History:  Diagnosis Date  . Asthma    Allergy-induced  . Bronchitis   . Cholestasis   . Fibromyalgia   . IUGR (intrauterine growth restriction) 02/18/2016  . S/P cesarean section 02/18/2016    Current Outpatient Medications:  .  cetirizine (ZYRTEC) 10 MG tablet, Take 10 mg by mouth daily., Disp: , Rfl:  .  docusate sodium (COLACE) 100 MG capsule, Take 200 mg by mouth at bedtime as needed for mild constipation., Disp: , Rfl:  .  ibuprofen (ADVIL) 800 MG tablet, Take 1 tablet (800 mg total) by mouth every 8 (eight) hours as needed for moderate pain., Disp: 30 tablet, Rfl: 0 .  Magnesium Oxide 250 MG TABS, Take 500 mg by mouth at bedtime., Disp: , Rfl:  .  oxyCODONE (OXY IR/ROXICODONE) 5 MG immediate release tablet, Take 1-2 tablets (5-10 mg total) by mouth every 6 (six) hours as needed for moderate pain or severe pain., Disp: 30 tablet, Rfl: 0 .  pantoprazole (PROTONIX) 40 MG tablet, Take 40 mg by mouth 2 (two) times daily. , Disp: , Rfl:  .  Prenatal Vit-Fe Fumarate-FA (PRENATAL MULTIVITAMIN) TABS tablet, Take 1 tablet by mouth daily at 12 noon., Disp: 100 tablet, Rfl: 3 .  sucralfate (CARAFATE) 1 g tablet, Take 1 g by mouth 4 (four) times daily., Disp: , Rfl:    Social History   Tobacco Use  Smoking Status Never Smoker  Smokeless Tobacco Never Used    No Known Allergies Objective:  There were no vitals filed for this visit. There is no height or weight on file to calculate BMI. Constitutional Well developed. Well nourished.  Vascular Dorsalis pedis pulses palpable bilaterally. Posterior tibial pulses palpable bilaterally. Capillary refill normal to all digits.  No cyanosis or clubbing noted. Pedal hair growth normal.  Neurologic Normal speech. Oriented to person, place, and time. Epicritic sensation to light touch grossly present bilaterally.  Dermatologic Nails well groomed and normal in appearance. No open wounds. No skin lesions.  Orthopedic: Normal joint ROM without pain or crepitus bilaterally. No visible deformities. Tender to palpation at the calcaneal tuber bilaterally. No pain with calcaneal squeeze bilaterally. Ankle ROM diminished range of motion bilaterally. Silfverskiold Test: positive bilaterally.   Radiographs: Taken and reviewed. No acute fractures or dislocations. No evidence of stress fracture.  Plantar heel spur present. Posterior heel spur present.   Assessment:   1. Plantar fasciitis of right foot   2. Plantar fasciitis of left foot    Plan:  Patient was evaluated and treated and all questions answered.  Plantar Fasciitis, bilaterally - XR reviewed as above.  - Educated on icing and stretching. Instructions given.  - Injection delivered to  the plantar fascia as below. - DME: Plantar Fascial Brace - Pharmacologic management: None  Procedure: Injection Tendon/Ligament Location: Bilateral plantar fascia at the glabrous junction; medial approach. Skin Prep: alcohol Injectate: 0.5 cc 0.5% marcaine plain, 0.5 cc of 1% Lidocaine, 0.5 cc kenalog 10. Disposition: Patient tolerated procedure well. Injection site dressed with a band-aid.  No follow-ups on file.

## 2020-09-27 ENCOUNTER — Ambulatory Visit: Payer: BC Managed Care – PPO | Admitting: Podiatry

## 2021-12-19 ENCOUNTER — Ambulatory Visit (HOSPITAL_BASED_OUTPATIENT_CLINIC_OR_DEPARTMENT_OTHER): Admit: 2021-12-19 | Payer: BLUE CROSS/BLUE SHIELD | Admitting: Obstetrics and Gynecology

## 2021-12-19 ENCOUNTER — Encounter (HOSPITAL_BASED_OUTPATIENT_CLINIC_OR_DEPARTMENT_OTHER): Payer: Self-pay

## 2021-12-19 DIAGNOSIS — Z302 Encounter for sterilization: Secondary | ICD-10-CM

## 2021-12-19 SURGERY — SALPINGECTOMY, BILATERAL, LAPAROSCOPIC
Anesthesia: General | Laterality: Bilateral

## 2022-06-15 LAB — OB RESULTS CONSOLE ABO/RH: RH Type: POSITIVE

## 2022-06-15 LAB — OB RESULTS CONSOLE RUBELLA ANTIBODY, IGM: Rubella: IMMUNE

## 2022-06-15 LAB — OB RESULTS CONSOLE ANTIBODY SCREEN: Antibody Screen: NEGATIVE

## 2022-06-15 LAB — OB RESULTS CONSOLE RPR: RPR: NONREACTIVE

## 2022-06-15 LAB — HEPATITIS C ANTIBODY: HCV Ab: NEGATIVE

## 2022-06-15 LAB — OB RESULTS CONSOLE GC/CHLAMYDIA
Chlamydia: NEGATIVE
Neisseria Gonorrhea: NEGATIVE

## 2022-06-15 LAB — OB RESULTS CONSOLE HIV ANTIBODY (ROUTINE TESTING): HIV: NONREACTIVE

## 2022-06-15 LAB — OB RESULTS CONSOLE HEPATITIS B SURFACE ANTIGEN: Hepatitis B Surface Ag: NEGATIVE

## 2022-12-16 ENCOUNTER — Encounter: Payer: Self-pay | Admitting: Pediatrics

## 2022-12-18 LAB — OB RESULTS CONSOLE GBS: GBS: POSITIVE

## 2022-12-22 ENCOUNTER — Ambulatory Visit (INDEPENDENT_AMBULATORY_CARE_PROVIDER_SITE_OTHER): Payer: Self-pay | Admitting: Pediatrics

## 2022-12-22 DIAGNOSIS — Z7681 Expectant parent(s) prebirth pediatrician visit: Secondary | ICD-10-CM

## 2022-12-24 ENCOUNTER — Encounter (HOSPITAL_COMMUNITY): Payer: Self-pay | Admitting: *Deleted

## 2022-12-24 ENCOUNTER — Encounter (HOSPITAL_COMMUNITY): Payer: Self-pay

## 2022-12-24 NOTE — Patient Instructions (Signed)
SHARADA ALBORNOZ  12/24/2022   Your procedure is scheduled on:  01/07/2023  Arrive at 0800 at Entrance C on [address] at [facility]  and [employer]. You are invited to use the FREE valet parking or use the Visitor's parking deck.  Pick up the phone at the desk and dial [phone].  Call this number if you have problems the morning of surgery: [phone]  Remember:   Do not eat food:(After Midnight) Desps de medianoche.  Do not drink clear liquids: (After Midnight) Desps de medianoche.  Take these medicines the morning of surgery with A SIP OF WATER:  none   Do not wear jewelry, make-up or nail polish.  Do not wear lotions, powders, or perfumes. Do not wear deodorant.  Do not shave 48 hours prior to surgery.  Do not bring valuables to the hospital.  [facility] is not   responsible for any belongings or valuables brought to the hospital.  Contacts, dentures or bridgework may not be worn into surgery.  Leave suitcase in the car. After surgery it may be brought to your room.  For patients admitted to the hospital, checkout time is 11:00 AM the day of              discharge.      Please read over the following fact sheets that you were given:     Preparing for Surgery

## 2023-01-04 NOTE — Progress Notes (Signed)
Prenatal counseling for impending newborn done--  Reviewed current vaccine policy and answered all questions.  3rd child, Planned for repeat Csec.  Current complications:  none, Prenatal care initiated:  early Z76.81

## 2023-01-05 ENCOUNTER — Encounter (HOSPITAL_COMMUNITY)
Admission: RE | Admit: 2023-01-05 | Discharge: 2023-01-05 | Disposition: A | Discharge status: Home / Self Care | Payer: 59 | Source: Ambulatory Visit | Attending: Obstetrics and Gynecology | Admitting: Obstetrics and Gynecology

## 2023-01-05 DIAGNOSIS — Z01812 Encounter for preprocedural laboratory examination: Secondary | ICD-10-CM | POA: Insufficient documentation

## 2023-01-05 DIAGNOSIS — Z98891 History of uterine scar from previous surgery: Secondary | ICD-10-CM

## 2023-01-05 LAB — RPR: RPR Ser Ql: NONREACTIVE

## 2023-01-05 LAB — CBC
HCT: 39.2 % (ref 36.0–46.0)
Hemoglobin: 13.7 g/dL (ref 12.0–15.0)
MCH: 30.4 pg (ref 26.0–34.0)
MCHC: 34.9 g/dL (ref 30.0–36.0)
MCV: 87.1 fL (ref 80.0–100.0)
Platelets: 167 10*3/uL (ref 150–400)
RBC: 4.5 MIL/uL (ref 3.87–5.11)
RDW: 14.3 % (ref 11.5–15.5)
WBC: 8.3 10*3/uL (ref 4.0–10.5)
nRBC: 0 % (ref 0.0–0.2)

## 2023-01-05 LAB — TYPE AND SCREEN
ABO/RH(D): O POS
Antibody Screen: NEGATIVE

## 2023-01-05 NOTE — Patient Instructions (Signed)
PADME ARRIAGA  01/05/2023   Your procedure is scheduled on:  01/07/2023  Arrive at 0800 at Entrance C on [address] at [facility]  and [employer]. You are invited to use the FREE valet parking or use the Visitor's parking deck.  Pick up the phone at the desk and dial [phone].  Call this number if you have problems the morning of surgery: [phone]  Remember:   Do not eat food:(After Midnight) Desps de medianoche.  Do not drink clear liquids: (After Midnight) Desps de medianoche.  Take these medicines the morning of surgery with A SIP OF WATER:  none   Do not wear jewelry, make-up or nail polish.  Do not wear lotions, powders, or perfumes. Do not wear deodorant.  Do not shave 48 hours prior to surgery.  Do not bring valuables to the hospital.  [facility] is not   responsible for any belongings or valuables brought to the hospital.  Contacts, dentures or bridgework may not be worn into surgery.  Leave suitcase in the car. After surgery it may be brought to your room.  For patients admitted to the hospital, checkout time is 11:00 AM the day of              discharge.      Please read over the following fact sheets that you were given:     Preparing for Surgery

## 2023-01-06 NOTE — H&P (Signed)
Jennifer [name] is a 37 y.o. female, G4 P1112, EGA [redacted] weeks with EDC 5-9 presenting for repeat c-section and BTL.  First 2 deliveries by c-section, declines VBAC, wants permanent sterility.  OB History     Gravida  4   Para  2   Term  1   Preterm  1   AB  1   Living  2      SAB  1   IAB      Ectopic      Multiple  0   Live Births  2          Past Medical History:  Diagnosis Date   Asthma    Allergy-induced   Bronchitis    Cholestasis    Fibromyalgia    IUGR (intrauterine growth restriction) 02/18/2016   S/P cesarean section 02/18/2016   Past Surgical History:  Procedure Laterality Date   CESAREAN SECTION N/A 02/17/2016   Procedure: CESAREAN SECTION;  Surgeon: Sherian Rein, MD;  Location: [facility];  Service: Obstetrics;  Laterality: N/A;   CESAREAN SECTION N/A 01/16/2019   Procedure: CESAREAN SECTION;  Surgeon: Lavina Hamman, MD;  Location: [facility] LD ORS;  Service: Obstetrics;  Laterality: N/A;  Jennifer [name],  RNFA   KNEE SURGERY     WISDOM TOOTH EXTRACTION     Family History: family history includes Diabetes in her father, maternal grandfather, mother, paternal grandfather, paternal grandmother, and another family member; Thyroid disease in her father and mother. Social History:  reports that she has never smoked. She has never used smokeless tobacco. She reports that she does not drink alcohol and does not use drugs.     Maternal Diabetes: No Genetic Screening: Normal Maternal Ultrasounds/Referrals: Normal Fetal Ultrasounds or other Referrals:  None Maternal Substance Abuse:  No Significant Maternal Medications:  None Significant Maternal Lab Results:  Group B Strep positive Number of Prenatal Visits:greater than 3 verified prenatal visits Other Comments:  None  Review of Systems  Respiratory: Negative.    Cardiovascular: Negative.    Maternal Medical History:  Fetal activity: Perceived fetal activity is normal.   Prenatal  complications: no prenatal complications Prenatal Complications - Diabetes: none.     unknown if currently breastfeeding. Maternal Exam:  Abdomen: Patient reports no abdominal tenderness. Surgical scars: low transverse.   Estimated fetal weight is 8 lbs.   Introitus: Normal vulva. Normal vagina.  Amniotic fluid character: not assessed.   Physical Exam Constitutional:      Appearance: Normal appearance.  Cardiovascular:     Rate and Rhythm: Normal rate and regular rhythm.     Heart sounds: Normal heart sounds. No murmur heard. Pulmonary:     Effort: Pulmonary effort is normal. No respiratory distress.     Breath sounds: Normal breath sounds. No wheezing.  Abdominal:     Palpations: Abdomen is soft.  Genitourinary:    General: Normal vulva.  Musculoskeletal:     Cervical back: Normal range of motion and neck supple.  Neurological:     Mental Status: She is alert.     Prenatal labs: ABO, Rh: --/--/O POS (04/30 1104) Antibody: NEG (04/30 1104) Rubella: Immune (10/09 0000) RPR: NON REACTIVE (04/30 1104)  HBsAg: Negative (10/09 0000)  HIV: Non-reactive (10/09 0000)  GBS: Positive/-- (04/12 0000)   Assessment/Plan: IUP at 39 weeks, previous c-section x 2, desires permanent sterility.  Surgical procedure, risks, permanency and failure rate of BTL all discussed, questions answered.  Will admit for repeat c-section and bilateral salpingectomy  Leighton Jennifer [name] Jennifer [name] 01/06/2023, 1:53 PM

## 2023-01-07 ENCOUNTER — Other Ambulatory Visit: Payer: Self-pay

## 2023-01-07 ENCOUNTER — Encounter (HOSPITAL_COMMUNITY): Payer: Self-pay | Admitting: Obstetrics and Gynecology

## 2023-01-07 ENCOUNTER — Inpatient Hospital Stay (HOSPITAL_COMMUNITY)
Admission: AD | Admit: 2023-01-07 | Discharge: 2023-01-09 | DRG: 785 | Disposition: A | Discharge status: Home / Self Care | Payer: 59 | Attending: Obstetrics and Gynecology | Admitting: Obstetrics and Gynecology

## 2023-01-07 ENCOUNTER — Inpatient Hospital Stay (HOSPITAL_COMMUNITY): Payer: 59

## 2023-01-07 ENCOUNTER — Encounter (HOSPITAL_COMMUNITY)
Admission: AD | Disposition: A | Discharge status: Home / Self Care | Payer: Self-pay | Source: Home / Self Care | Attending: Obstetrics and Gynecology

## 2023-01-07 DIAGNOSIS — O34211 Maternal care for low transverse scar from previous cesarean delivery: Secondary | ICD-10-CM

## 2023-01-07 DIAGNOSIS — O99824 Streptococcus B carrier state complicating childbirth: Secondary | ICD-10-CM | POA: Diagnosis present

## 2023-01-07 DIAGNOSIS — Z01812 Encounter for preprocedural laboratory examination: Secondary | ICD-10-CM | POA: Diagnosis not present

## 2023-01-07 DIAGNOSIS — O99214 Obesity complicating childbirth: Secondary | ICD-10-CM | POA: Diagnosis present

## 2023-01-07 DIAGNOSIS — Z3A39 39 weeks gestation of pregnancy: Secondary | ICD-10-CM

## 2023-01-07 DIAGNOSIS — Z302 Encounter for sterilization: Secondary | ICD-10-CM

## 2023-01-07 DIAGNOSIS — Z98891 History of uterine scar from previous surgery: Secondary | ICD-10-CM

## 2023-01-07 SURGERY — Surgical Case
Anesthesia: Spinal | Laterality: Bilateral

## 2023-01-07 MED ORDER — SODIUM CHLORIDE 0.9% FLUSH: 3.0000 mL | INTRAVENOUS | Status: DC | PRN

## 2023-01-07 MED ORDER — ONDANSETRON HCL 4 MG/2ML IJ SOLN
INTRAMUSCULAR | Status: DC | PRN
  Administered 2023-01-07: 4 mg via INTRAVENOUS

## 2023-01-07 MED ORDER — TETANUS-DIPHTH-ACELL PERTUSSIS 5-2.5-18.5 LF-MCG/0.5 IM SUSY: 0.5000 mL | PREFILLED_SYRINGE | Freq: Once | INTRAMUSCULAR | Status: DC

## 2023-01-07 MED ORDER — SCOPOLAMINE 1 MG/3DAYS TD PT72
MEDICATED_PATCH | TRANSDERMAL | Status: AC
  Filled 2023-01-07: qty 1

## 2023-01-07 MED ORDER — HYDROMORPHONE HCL 1 MG/ML IJ SOLN: 0.2500 mg | INTRAMUSCULAR | Status: DC | PRN

## 2023-01-07 MED ORDER — OXYCODONE HCL 5 MG PO TABS: 5.0000 mg | ORAL_TABLET | ORAL | Status: DC | PRN

## 2023-01-07 MED ORDER — NALOXONE HCL 0.4 MG/ML IJ SOLN: 0.4000 mg | INTRAMUSCULAR | Status: DC | PRN

## 2023-01-07 MED ORDER — KETOROLAC TROMETHAMINE 30 MG/ML IJ SOLN
30.0000 mg | Freq: Four times a day (QID) | INTRAMUSCULAR | Status: AC
  Administered 2023-01-07 (×2): 30 mg via INTRAVENOUS
  Filled 2023-01-07 (×2): qty 1

## 2023-01-07 MED ORDER — CEFAZOLIN SODIUM-DEXTROSE 2-4 GM/100ML-% IV SOLN
2.0000 g | INTRAVENOUS | Status: AC
  Administered 2023-01-07: 2 g via INTRAVENOUS

## 2023-01-07 MED ORDER — OXYTOCIN-SODIUM CHLORIDE 30-0.9 UT/500ML-% IV SOLN
INTRAVENOUS | Status: DC | PRN
  Administered 2023-01-07: 200 mL via INTRAVENOUS

## 2023-01-07 MED ORDER — SODIUM CHLORIDE 0.9 % IR SOLN
Status: DC | PRN
  Administered 2023-01-07 (×2): 1

## 2023-01-07 MED ORDER — SCOPOLAMINE 1 MG/3DAYS TD PT72
1.0000 | MEDICATED_PATCH | Freq: Once | TRANSDERMAL | Status: DC
  Administered 2023-01-07: 1.5 mg via TRANSDERMAL

## 2023-01-07 MED ORDER — SIMETHICONE 80 MG PO CHEW: 80.0000 mg | CHEWABLE_TABLET | ORAL | Status: DC | PRN

## 2023-01-07 MED ORDER — COCONUT OIL OIL
1.0000 | TOPICAL_OIL | Status: DC | PRN
  Administered 2023-01-08: 1 via TOPICAL

## 2023-01-07 MED ORDER — SENNOSIDES-DOCUSATE SODIUM 8.6-50 MG PO TABS
2.0000 | ORAL_TABLET | ORAL | Status: DC
  Administered 2023-01-08: 2 via ORAL
  Filled 2023-01-07: qty 2

## 2023-01-07 MED ORDER — DIPHENHYDRAMINE HCL 25 MG PO CAPS: 25.0000 mg | ORAL_CAPSULE | Freq: Four times a day (QID) | ORAL | Status: DC | PRN

## 2023-01-07 MED ORDER — ENOXAPARIN SODIUM 60 MG/0.6ML IJ SOSY
0.5000 mg/kg | PREFILLED_SYRINGE | INTRAMUSCULAR | Status: DC
  Administered 2023-01-08 – 2023-01-09 (×2): 52.5 mg via SUBCUTANEOUS
  Filled 2023-01-07 (×2): qty 0.6

## 2023-01-07 MED ORDER — OXYTOCIN-SODIUM CHLORIDE 30-0.9 UT/500ML-% IV SOLN
2.5000 [IU]/h | INTRAVENOUS | Status: AC
  Administered 2023-01-07: 2.5 [IU]/h via INTRAVENOUS
  Filled 2023-01-07: qty 500

## 2023-01-07 MED ORDER — LACTATED RINGERS IV SOLN: INTRAVENOUS | Status: DC

## 2023-01-07 MED ORDER — NALOXONE HCL 4 MG/10ML IJ SOLN: 1.0000 ug/kg/h | INTRAVENOUS | Status: DC | PRN

## 2023-01-07 MED ORDER — MORPHINE SULFATE (PF) 0.5 MG/ML IJ SOLN
INTRAMUSCULAR | Status: DC | PRN
  Administered 2023-01-07: 150 ug via INTRATHECAL

## 2023-01-07 MED ORDER — PRENATAL MULTIVITAMIN CH
1.0000 | ORAL_TABLET | Freq: Every day | ORAL | Status: DC
  Administered 2023-01-08 – 2023-01-09 (×2): 1 via ORAL
  Filled 2023-01-07 (×2): qty 1

## 2023-01-07 MED ORDER — DIBUCAINE (PERIANAL) 1 % EX OINT: 1.0000 | TOPICAL_OINTMENT | CUTANEOUS | Status: DC | PRN

## 2023-01-07 MED ORDER — BUPIVACAINE IN DEXTROSE 0.75-8.25 % IT SOLN
INTRATHECAL | Status: DC | PRN
  Administered 2023-01-07: 1.6 mL via INTRATHECAL

## 2023-01-07 MED ORDER — DIPHENHYDRAMINE HCL 50 MG/ML IJ SOLN
12.5000 mg | INTRAMUSCULAR | Status: DC | PRN
  Administered 2023-01-07: 12.5 mg via INTRAVENOUS
  Filled 2023-01-07: qty 1

## 2023-01-07 MED ORDER — METHYLERGONOVINE MALEATE 0.2 MG PO TABS: 0.2000 mg | ORAL_TABLET | ORAL | Status: DC | PRN

## 2023-01-07 MED ORDER — MEPERIDINE HCL 25 MG/ML IJ SOLN: 6.2500 mg | INTRAMUSCULAR | Status: DC | PRN

## 2023-01-07 MED ORDER — KETOROLAC TROMETHAMINE 30 MG/ML IJ SOLN: 30.0000 mg | Freq: Once | INTRAMUSCULAR | Status: DC | PRN

## 2023-01-07 MED ORDER — ZOLPIDEM TARTRATE 5 MG PO TABS: 5.0000 mg | ORAL_TABLET | Freq: Every evening | ORAL | Status: DC | PRN

## 2023-01-07 MED ORDER — BUSPIRONE HCL 5 MG PO TABS
7.5000 mg | ORAL_TABLET | Freq: Every day | ORAL | Status: DC
  Administered 2023-01-08 – 2023-01-09 (×2): 7.5 mg via ORAL
  Filled 2023-01-07 (×2): qty 2

## 2023-01-07 MED ORDER — MAGNESIUM HYDROXIDE 400 MG/5ML PO SUSP: 30.0000 mL | ORAL | Status: DC | PRN

## 2023-01-07 MED ORDER — POVIDONE-IODINE 10 % EX SWAB
2.0000 | Freq: Once | CUTANEOUS | Status: AC
  Administered 2023-01-07: 2 via TOPICAL

## 2023-01-07 MED ORDER — ONDANSETRON HCL 4 MG/2ML IJ SOLN: 4.0000 mg | Freq: Three times a day (TID) | INTRAMUSCULAR | Status: DC | PRN

## 2023-01-07 MED ORDER — DIPHENHYDRAMINE HCL 25 MG PO CAPS: 25.0000 mg | ORAL_CAPSULE | ORAL | Status: DC | PRN

## 2023-01-07 MED ORDER — FENTANYL CITRATE (PF) 100 MCG/2ML IJ SOLN
INTRAMUSCULAR | Status: DC | PRN
  Administered 2023-01-07: 15 ug via INTRATHECAL

## 2023-01-07 MED ORDER — KETOROLAC TROMETHAMINE 30 MG/ML IJ SOLN
INTRAMUSCULAR | Status: AC
  Filled 2023-01-07: qty 1

## 2023-01-07 MED ORDER — OXYCODONE HCL 5 MG/5ML PO SOLN
5.0000 mg | Freq: Once | ORAL | Status: DC | PRN
  Filled 2023-01-07: qty 5

## 2023-01-07 MED ORDER — EPHEDRINE SULFATE-NACL 50-0.9 MG/10ML-% IV SOSY
PREFILLED_SYRINGE | INTRAVENOUS | Status: DC | PRN
  Administered 2023-01-07: 5 mg via INTRAVENOUS

## 2023-01-07 MED ORDER — AMISULPRIDE (ANTIEMETIC) 5 MG/2ML IV SOLN: 10.0000 mg | Freq: Once | INTRAVENOUS | Status: DC | PRN

## 2023-01-07 MED ORDER — WITCH HAZEL-GLYCERIN EX PADS: 1.0000 | MEDICATED_PAD | CUTANEOUS | Status: DC | PRN

## 2023-01-07 MED ORDER — ACETAMINOPHEN 500 MG PO TABS
1000.0000 mg | ORAL_TABLET | Freq: Four times a day (QID) | ORAL | Status: DC
  Administered 2023-01-07 – 2023-01-09 (×8): 1000 mg via ORAL
  Filled 2023-01-07 (×8): qty 2

## 2023-01-07 MED ORDER — EPHEDRINE 5 MG/ML INJ
INTRAVENOUS | Status: AC
  Filled 2023-01-07: qty 5

## 2023-01-07 MED ORDER — FENTANYL CITRATE (PF) 100 MCG/2ML IJ SOLN
INTRAMUSCULAR | Status: AC
  Filled 2023-01-07: qty 2

## 2023-01-07 MED ORDER — MORPHINE SULFATE (PF) 0.5 MG/ML IJ SOLN
INTRAMUSCULAR | Status: AC
  Filled 2023-01-07: qty 10

## 2023-01-07 MED ORDER — ONDANSETRON HCL 4 MG/2ML IJ SOLN
4.0000 mg | Freq: Four times a day (QID) | INTRAMUSCULAR | Status: DC | PRN
  Administered 2023-01-07: 4 mg via INTRAVENOUS
  Filled 2023-01-07: qty 2

## 2023-01-07 MED ORDER — DEXAMETHASONE SODIUM PHOSPHATE 10 MG/ML IJ SOLN
INTRAMUSCULAR | Status: DC | PRN
  Administered 2023-01-07: 10 mg via INTRAVENOUS

## 2023-01-07 MED ORDER — DEXAMETHASONE SODIUM PHOSPHATE 10 MG/ML IJ SOLN
INTRAMUSCULAR | Status: AC
  Filled 2023-01-07: qty 1

## 2023-01-07 MED ORDER — KETOROLAC TROMETHAMINE 30 MG/ML IJ SOLN: 30.0000 mg | Freq: Four times a day (QID) | INTRAMUSCULAR | Status: DC | PRN

## 2023-01-07 MED ORDER — ACETAMINOPHEN 10 MG/ML IV SOLN
INTRAVENOUS | Status: DC | PRN
  Administered 2023-01-07: 1000 mg via INTRAVENOUS

## 2023-01-07 MED ORDER — ONDANSETRON HCL 4 MG/2ML IJ SOLN
INTRAMUSCULAR | Status: AC
  Filled 2023-01-07: qty 2

## 2023-01-07 MED ORDER — ONDANSETRON HCL 4 MG/2ML IJ SOLN: 4.0000 mg | Freq: Once | INTRAMUSCULAR | Status: DC | PRN

## 2023-01-07 MED ORDER — ACETAMINOPHEN 500 MG PO TABS: 1000.0000 mg | ORAL_TABLET | Freq: Four times a day (QID) | ORAL | Status: DC

## 2023-01-07 MED ORDER — IBUPROFEN 600 MG PO TABS
600.0000 mg | ORAL_TABLET | Freq: Four times a day (QID) | ORAL | Status: DC
  Administered 2023-01-08 – 2023-01-09 (×5): 600 mg via ORAL
  Filled 2023-01-07 (×5): qty 1

## 2023-01-07 MED ORDER — MENTHOL 3 MG MT LOZG: 1.0000 | LOZENGE | OROMUCOSAL | Status: DC | PRN

## 2023-01-07 MED ORDER — SODIUM CHLORIDE 0.9 % IV SOLN
25.0000 mg | Freq: Four times a day (QID) | INTRAVENOUS | Status: DC | PRN
  Administered 2023-01-07: 25 mg via INTRAVENOUS
  Filled 2023-01-07: qty 1

## 2023-01-07 MED ORDER — OXYCODONE HCL 5 MG PO TABS: 5.0000 mg | ORAL_TABLET | Freq: Once | ORAL | Status: DC | PRN

## 2023-01-07 MED ORDER — METHYLERGONOVINE MALEATE 0.2 MG/ML IJ SOLN: 0.2000 mg | INTRAMUSCULAR | Status: DC | PRN

## 2023-01-07 MED ORDER — MEASLES, MUMPS & RUBELLA VAC IJ SOLR: 0.5000 mL | Freq: Once | INTRAMUSCULAR | Status: DC

## 2023-01-07 MED ORDER — OXYTOCIN-SODIUM CHLORIDE 30-0.9 UT/500ML-% IV SOLN
INTRAVENOUS | Status: AC
  Filled 2023-01-07: qty 1000

## 2023-01-07 MED ORDER — KETOROLAC TROMETHAMINE 30 MG/ML IJ SOLN
INTRAMUSCULAR | Status: DC | PRN
  Administered 2023-01-07: 30 mg via INTRAVENOUS

## 2023-01-07 MED ORDER — PHENYLEPHRINE HCL-NACL 20-0.9 MG/250ML-% IV SOLN
INTRAVENOUS | Status: DC | PRN
  Administered 2023-01-07: 60 ug/min via INTRAVENOUS

## 2023-01-07 MED ORDER — ACETAMINOPHEN 10 MG/ML IV SOLN
INTRAVENOUS | Status: AC
  Filled 2023-01-07: qty 100

## 2023-01-07 MED ORDER — CEFAZOLIN SODIUM-DEXTROSE 2-4 GM/100ML-% IV SOLN
INTRAVENOUS | Status: AC
  Filled 2023-01-07: qty 100

## 2023-01-07 MED ORDER — STERILE WATER FOR IRRIGATION IR SOLN
Status: DC | PRN
  Administered 2023-01-07: 1000 mL

## 2023-01-07 SURGICAL SUPPLY — 34 items
APL PRP STRL LF DISP 70% ISPRP (MISCELLANEOUS) ×2
APL SKNCLS STERI-STRIP NONHPOA (GAUZE/BANDAGES/DRESSINGS) ×1
BENZOIN TINCTURE PRP APPL 2/3 (GAUZE/BANDAGES/DRESSINGS) IMPLANT
CHLORAPREP W/TINT 26 (MISCELLANEOUS) ×4 IMPLANT
CLAMP UMBILICAL CORD (MISCELLANEOUS) ×2 IMPLANT
CLOTH BEACON ORANGE TIMEOUT ST (SAFETY) ×2 IMPLANT
DRSG OPSITE POSTOP 4X10 (GAUZE/BANDAGES/DRESSINGS) ×2 IMPLANT
ELECT REM PT RETURN 9FT ADLT (ELECTROSURGICAL) ×1
ELECTRODE REM PT RTRN 9FT ADLT (ELECTROSURGICAL) ×2 IMPLANT
EXTRACTOR VACUUM KIWI (MISCELLANEOUS) IMPLANT
EXTRACTOR VACUUM M CUP 4 TUBE (SUCTIONS) IMPLANT
GLOVE BIOGEL PI IND STRL 7.0 (GLOVE) ×2 IMPLANT
GLOVE ORTHO TXT STRL SZ7.5 (GLOVE) ×2 IMPLANT
GOWN STRL REUS W/TWL LRG LVL3 (GOWN DISPOSABLE) ×4 IMPLANT
KIT ABG SYR 3ML LUER SLIP (SYRINGE) IMPLANT
MAT PREVALON FULL STRYKER (MISCELLANEOUS) IMPLANT
NDL HYPO 25X5/8 SAFETYGLIDE (NEEDLE) ×2 IMPLANT
NEEDLE HYPO 25X5/8 SAFETYGLIDE (NEEDLE) ×1 IMPLANT
NS IRRIG 1000ML POUR BTL (IV SOLUTION) ×2 IMPLANT
PACK C SECTION WH (CUSTOM PROCEDURE TRAY) ×2 IMPLANT
PAD OB MATERNITY 4.3X12.25 (PERSONAL CARE ITEMS) ×2 IMPLANT
RTRCTR C-SECT PINK 25CM LRG (MISCELLANEOUS) ×2 IMPLANT
STRIP CLOSURE SKIN 1/2X4 (GAUZE/BANDAGES/DRESSINGS) IMPLANT
SUT CHROMIC 1 CTX 36 (SUTURE) ×4 IMPLANT
SUT PLAIN 0 NONE (SUTURE) IMPLANT
SUT PLAIN 2 0 XLH (SUTURE) IMPLANT
SUT VIC AB 0 CT1 27 (SUTURE) ×2
SUT VIC AB 0 CT1 27XBRD ANBCTR (SUTURE) ×4 IMPLANT
SUT VIC AB 2-0 CT1 27 (SUTURE) ×1
SUT VIC AB 2-0 CT1 TAPERPNT 27 (SUTURE) ×2 IMPLANT
SUT VIC AB 4-0 KS 27 (SUTURE) IMPLANT
TOWEL OR 17X24 6PK STRL BLUE (TOWEL DISPOSABLE) ×2 IMPLANT
TRAY FOLEY W/BAG SLVR 14FR LF (SET/KITS/TRAYS/PACK) ×2 IMPLANT
WATER STERILE IRR 1000ML POUR (IV SOLUTION) ×2 IMPLANT

## 2023-01-07 NOTE — Interval H&P Note (Signed)
History and Physical Interval Note:  01/07/2023 9:31 AM  Jennifer [name]  has presented today for surgery, with the diagnosis of previous cesarean section patient desires  sterilization.  The various methods of treatment have been discussed with the patient and family. After consideration of risks, benefits and other options for treatment, the patient has consented to  Procedure(s): CESAREAN SECTION WITH BILATERAL TUBAL LIGATION (Bilateral) as a surgical intervention.  The patient's history has been reviewed, patient examined, no change in status, stable for surgery.  I have reviewed the patient's chart and labs.  Questions were answered to the patient's satisfaction.     Leighton Roach Tiajah Oyster

## 2023-01-07 NOTE — Anesthesia Postprocedure Evaluation (Signed)
Anesthesia Post Note  Patient: Jennifer [name]  Procedure(s) Performed: CESAREAN SECTION WITH BILATERAL TUBAL LIGATION (Bilateral)     Patient location during evaluation: PACU Anesthesia Type: Spinal Level of consciousness: awake and alert and oriented Pain management: pain level controlled Vital Signs Assessment: post-procedure vital signs reviewed and stable Respiratory status: spontaneous breathing, nonlabored ventilation and respiratory function stable Cardiovascular status: blood pressure returned to baseline and stable Postop Assessment: no headache, no backache, spinal receding and patient able to bend at knees Anesthetic complications: no   No notable events documented.  Last Vitals:  Vitals:   01/07/23 1245 01/07/23 1355  BP: (!) 119/56 125/78  Pulse: 62 67  Resp: 14 16  Temp: 36.6 C 36.7 C  SpO2: 98% 98%    Last Pain:  Vitals:   01/07/23 1355  TempSrc: Oral  PainSc: 0-No pain   Pain Goal:                   Lannie Fields

## 2023-01-07 NOTE — Anesthesia Procedure Notes (Signed)
Spinal  Patient location during procedure: OR Start time: 01/07/2023 9:57 AM End time: 01/07/2023 10:02 AM Reason for block: surgical anesthesia Staffing Performed: anesthesiologist  Anesthesiologist: Lannie Fields, DO Performed by: Lannie Fields, DO Authorized by: Lannie Fields, DO   Preanesthetic Checklist Completed: patient identified, IV checked, risks and benefits discussed, surgical consent, monitors and equipment checked, pre-op evaluation and timeout performed Spinal Block Patient position: sitting Prep: DuraPrep and site prepped and draped Patient monitoring: cardiac monitor, continuous pulse ox and blood pressure Approach: midline Location: L3-4 Injection technique: single-shot Needle Needle type: Pencan  Needle gauge: 24 G Needle length: 9 cm Assessment Sensory level: T6 Events: CSF return Additional Notes Functioning IV was confirmed and monitors were applied. Sterile prep and drape, including hand hygiene and sterile gloves were used. The patient was positioned and the spine was prepped. The skin was anesthetized with lidocaine.  Free flow of clear CSF was obtained prior to injecting local anesthetic into the CSF.  The spinal needle aspirated freely following injection.  The needle was carefully withdrawn.  The patient tolerated the procedure well.

## 2023-01-07 NOTE — Anesthesia Preprocedure Evaluation (Addendum)
Anesthesia Evaluation  Patient identified by MRN, date of birth, ID band Patient awake    Reviewed: Allergy & Precautions, NPO status , Patient's Chart, lab work & pertinent test results  Airway Mallampati: III  TM Distance: >3 FB Neck ROM: Full    Dental no notable dental hx.    Pulmonary asthma (no inhaler)    Pulmonary exam normal breath sounds clear to auscultation       Cardiovascular negative cardio ROS Normal cardiovascular exam Rhythm:Regular Rate:Normal     Neuro/Psych negative neurological ROS  negative psych ROS   GI/Hepatic negative GI ROS, Neg liver ROS,,,  Endo/Other    Morbid obesity  Renal/GU negative Renal ROS  negative genitourinary   Musculoskeletal  (+)  Fibromyalgia -  Abdominal  (+) + obese  Peds negative pediatric ROS (+)  Hematology negative hematology ROS (+) Hb 13.7, plt 167   Anesthesia Other Findings   Reproductive/Obstetrics (+) Pregnancy 2 prior sections 2017, 2020                             Anesthesia Physical Anesthesia Plan  ASA: 3  Anesthesia Plan: Spinal   Post-op Pain Management: Regional block, Toradol IV (intra-op)* and Ofirmev IV (intra-op)*   Induction:   PONV Risk Score and Plan: 3 and Ondansetron, Dexamethasone and Treatment may vary due to age or medical condition  Airway Management Planned: Natural Airway and Nasal Cannula  Additional Equipment: None  Intra-op Plan:   Post-operative Plan:   Informed Consent: I have reviewed the patients History and Physical, chart, labs and discussed the procedure including the risks, benefits and alternatives for the proposed anesthesia with the patient or authorized representative who has indicated his/her understanding and acceptance.       Plan Discussed with: CRNA  Anesthesia Plan Comments:         Anesthesia Quick Evaluation

## 2023-01-07 NOTE — Transfer of Care (Signed)
Immediate Anesthesia Transfer of Care Note  Patient: Jennifer [name]  Procedure(s) Performed: CESAREAN SECTION WITH BILATERAL TUBAL LIGATION (Bilateral)  Patient Location: PACU  Anesthesia Type:Spinal  Level of Consciousness: awake, alert , and oriented  Airway & Oxygen Therapy: Patient Spontanous Breathing  Post-op Assessment: Report given to RN and Post -op Vital signs reviewed and stable  Post vital signs: Reviewed and stable  Last Vitals:  Vitals Value Taken Time  BP 114/54 01/07/23 1130  Temp    Pulse 69 01/07/23 1132  Resp 19 01/07/23 1132  SpO2 97 % 01/07/23 1132  Vitals shown include unvalidated device data.  Last Pain:  Vitals:   01/07/23 0840  TempSrc: Oral         Complications: No notable events documented.

## 2023-01-07 NOTE — Op Note (Signed)
Preoperative diagnosis: Intrauterine pregnancy at 39 weeks, previous c-section x 2, desires permanent sterility Postoperative diagnosis: Same Procedure: Repeat low transverse cesarean section without extensions, bilateral salpingectomy Surgeon: Lavina Hamman M.D. Anesthesia: Spinal  Findings: Patient had normal gravid anatomy and delivered a viable female infant with Apgars of 9 and 9 weight pending Estimated blood loss: 161 cc Specimens: Placenta sent to labor and delivery, bilateral fallopian tubes for routine pathology Complications: None  Procedure in detail: The patient was taken to the operating room and placed in the sitting position. The anesthesiologist instilled spinal anesthesia.  She was then placed in the dorsosupine position with left tilt. Abdomen was then prepped and draped in the usual sterile fashion, and a foley catheter was inserted. The level of her anesthesia was found to be adequate. Abdomen was entered via a standard Pfannenstiel incision through her previous scar. Once the peritoneal cavity was entered the Alexis disposable self-retaining retractor was placed after omental adhesions were taken down with Bovie and good visualization was achieved. A 4 cm transverse incision was then made in the lower uterine segment pushing the bladder inferior. Once the uterine cavity was entered the incision was extended digitally, clear amniotic fluid. The fetal vertex was grasped and delivered through the incision atraumatically. Mouth and nares were suctioned. The remainder of the infant then delivered atraumatically. Cord was doubly clamped and cut after one minute and the infant handed to the awaiting pediatric team. Cord blood was obtained. The placenta delivered spontaneously. Uterus was wiped dry with clean lap pad and all clots and debris were removed. Uterine incision was inspected and found to be free of extensions. Uterine incision was closed in 1 layer with running locking #1 Chromic.  Tubes and ovaries were inspected and found to be normal. Uterine incision was inspected and found to be hemostatic.  Both fallopian tubes were identified, elevated with Babcock clamps, and traced to their fimbriated ends.  Bovie was used to free the fimbria on each side from the ovary.  A Kelly clamp was then placed across the mesosalpinx and proximal tube.  Both tubes removed.  Both pedicles secured with 0 plain gut ties with good hemostasis.  Bleeding from serosal edges was controlled with electrocautery. The Alexis retractor was removed. Subfascial space was irrigated and made hemostatic with electrocautery. Peritoneum was closed with 2-0 Vicryl.  Fascia was closed in running fashion starting at both ends and meeting in the middle with 0 Vicryl. Subcutaneous tissue was then irrigated and made hemostatic with electrocautery, then closed with running 2-0 plain gut. Skin was closed with running 4-0 Vicryl subcuticular suture followed by steri-strips and a sterile dressing. Patient tolerated the procedure well and was taken to the recovery in stable condition. Counts were correct x2, she received Ancef 2 g IV at the beginning of the procedure and she had PAS hose on throughout the procedure.

## 2023-01-08 ENCOUNTER — Encounter (HOSPITAL_COMMUNITY): Payer: Self-pay | Admitting: Obstetrics and Gynecology

## 2023-01-08 LAB — CBC
HCT: 32.1 % — ABNORMAL LOW (ref 36.0–46.0)
Hemoglobin: 10.7 g/dL — ABNORMAL LOW (ref 12.0–15.0)
MCH: 29.8 pg (ref 26.0–34.0)
MCHC: 33.3 g/dL (ref 30.0–36.0)
MCV: 89.4 fL (ref 80.0–100.0)
Platelets: 163 10*3/uL (ref 150–400)
RBC: 3.59 MIL/uL — ABNORMAL LOW (ref 3.87–5.11)
RDW: 14 % (ref 11.5–15.5)
WBC: 12.9 10*3/uL — ABNORMAL HIGH (ref 4.0–10.5)
nRBC: 0 % (ref 0.0–0.2)

## 2023-01-08 NOTE — Progress Notes (Signed)
Subjective: Postpartum Day 1: Cesarean Delivery Patient is doing well this morning. Pain is controlled. Ambulating, voiding, tolerating PO. Minimal lochia. Breastfeeding.   Objective: Patient Vitals for the past 24 hrs:  BP Temp Temp src Pulse Resp SpO2 Height Weight  01/08/23 0407 128/76 97.9 F (36.6 C) Axillary 61 17 100 % -- --  01/07/23 2345 -- 98 F (36.7 C) Oral -- 18 98 % -- --  01/07/23 2005 104/62 98.3 F (36.8 C) Oral 60 18 96 % -- --  01/07/23 1608 115/76 97.7 F (36.5 C) Oral 60 16 97 % -- --  01/07/23 1455 116/77 98 F (36.7 C) Oral 62 16 -- -- --  01/07/23 1355 125/78 98 F (36.7 C) Oral 67 16 98 % -- --  01/07/23 1245 (!) 119/56 97.9 F (36.6 C) Oral 62 14 98 % -- --  01/07/23 1230 121/65 -- -- 69 15 94 % -- --  01/07/23 1215 116/84 -- -- 65 (!) 24 95 % -- --  01/07/23 1200 (!) 126/47 97.8 F (36.6 C) Oral 71 (!) 23 95 % -- --  01/07/23 1145 111/66 -- -- 68 (!) 21 93 % -- --  01/07/23 1130 (!) 114/54 97.8 F (36.6 C) -- 65 16 93 % -- --  01/07/23 0840 127/89 97.9 F (36.6 C) Oral 75 -- 96 % -- --  01/07/23 0838 -- -- Oral -- 18 -- 5\' 5"  (1.651 m) 102.7 kg    Physical Exam:  General: alert, cooperative, and no distress Lochia: appropriate Uterine Fundus: firm Incision: healing well, small amount of old blood stain on honeycomb, no dehiscence, no significant erythema DVT Evaluation: No evidence of DVT seen on physical exam.  Recent Labs    01/05/23 1104 01/08/23 0537  HGB 13.7 10.7*  HCT 39.2 32.1*    Assessment/Plan: Jennifer [name] [id number] POD#1 sp repeat cesarean and bilateral salpingectomy at [redacted]w[redacted]d 1. PPC: routine PP care 2. Rh pos 3. Desires neonatal circumcision, R/B/A of procedure discussed at length. Pt understands that neonatal circumcision is not considered medically necessary and is elective. The risks include, but are not limited to bleeding, infection, damage to the penis, development of scar tissue, and having to have it redone at a  later date. Pt understands theses risks and wishes to proceed.  4. Dispo: anticipate discharge home tomorrow  Charlett Nose 01/08/2023, 8:02 AM

## 2023-01-08 NOTE — Lactation Note (Signed)
This note was copied from a baby's chart. Lactation Consultation Note  Patient Name: Jennifer [name] Date: 01/08/2023 Age:35 hours Reason for consult: Initial assessment;Term  LC in to room for initial consult. LP is attempting latch upon arrival. LC reviewed how to use support pillows, alignment, optimal breast C-hold and neck/back support. Several attempts to latch unsuccessful. Colostrum present with hand expression. LC spoonfed ~1.63mL of colostrum. Able to latch infant, football hold to right side with good pillow support. Noted sucking and swallowing. Lactating parent (LP) verbalizes comfort with positioning and latch. Encouraged expressing breast milk, guide from nose to mouth until big gape.  Infant was unlatched after 8 minutes to get circumcised. Parent sent 10-mL of expressed breast milk to be fed after procedure. Breast milk labels for storage printed. Reviewed normal newborn behavior after 24h, expected output and feeding frequency. LP is experienced,provided breast milk to 2 older children for 12 months.   Plan: 1-Skin to skin, aim for a deep, comfortable latch and breastfeed on demand or 8-12 times in 24h period. 2-Encouraged maternal rest, hydration and food intake.  3-Contact LC as needed for feeds/support/concerns/questions   All questions answered at this time. Provided Lactation services brochure and other local resources.     Maternal Data Has patient been taught Hand Expression?: No Does the patient have breastfeeding experience prior to this delivery?: Yes How long did the patient breastfeed?: 12 months x2  Feeding Mother's Current Feeding Choice: Breast Milk  LATCH Score Latch: Grasps breast easily, tongue down, lips flanged, rhythmical sucking.  Audible Swallowing: Spontaneous and intermittent  Type of Nipple: Everted at rest and after stimulation (areolar edema)  Comfort (Breast/Nipple): Soft / non-tender  Hold (Positioning): Assistance needed to  correctly position infant at breast and maintain latch.  LATCH Score: 9   Lactation Tools Discussed/Used Tools: Pump;Flanges Flange Size: 24 Breast pump type: Double-Electric Breast Pump;Manual;Other (comment) (Personal in room -Spectra) Reason for Pumping: Stimulation and supplementation Pumping frequency: as needed Pumped volume: 10 mL (at 7AM)  Interventions Interventions: Breast feeding basics reviewed;Assisted with latch;Skin to skin;Breast massage;Hand express;Breast compression;Adjust position;Support pillows;Expressed milk;DEBP;Education;LC Services brochure  Discharge Pump: DEBP;Manual;Personal (Spectra)  Consult Status Consult Status: Follow-up Date: 01/09/23 Follow-up type: In-patient    Jennifer [name] 01/08/2023, 10:01 AM

## 2023-01-08 NOTE — Progress Notes (Signed)
Patient ID: Jennifer [name], female   DOB: October 20, 1985, 37 y.o.   MRN: [id number] Pt doing well. No complaints. +appetite.  Breastfeeding.   P: POD#0 - doing well.

## 2023-01-09 MED ORDER — OXYCODONE HCL 5 MG PO TABS: 5.0000 mg | ORAL_TABLET | Freq: Four times a day (QID) | ORAL | 0 refills | Status: AC | PRN

## 2023-01-09 NOTE — Discharge Summary (Signed)
Postpartum Discharge Summary  Date of Service updated 01/09/23     Patient Name: Jennifer [name] DOB: [birth year] MRN: [id number]  Date of admission: 01/07/2023 Delivery date:01/07/2023  Delivering provider: Jackelyn Knife, TODD  Date of discharge: 01/09/2023  Admitting diagnosis: S/P cesarean section [Z98.891] Intrauterine pregnancy: [redacted]w[redacted]d     Secondary diagnosis:  Principal Problem:   S/P cesarean section  Additional problems: None    Discharge diagnosis: Term Pregnancy Delivered                                              Post partum procedures: n/a Augmentation: N/A Complications: None  Hospital course: Sceduled C/S   37 y.o. yo [id number] at [redacted]w[redacted]d was admitted to the hospital 01/07/2023 for scheduled cesarean section with the following indication:Elective Repeat.Delivery details are as follows:  Membrane Rupture Time/Date: 10:27 AM ,01/07/2023   Delivery Method:C-Section, Low Transverse  Details of operation can be found in separate operative note.  Patient had a postpartum course complicated by none.  She is ambulating, tolerating a regular diet, passing flatus, and urinating well. Patient is discharged home in stable condition on  01/09/23        Newborn Data: Birth date:01/07/2023  Birth time:10:28 AM  Gender:Female  Living status:Living  Apgars:9 ,9  Weight:3650 g      Physical exam  Vitals:   01/08/23 0407 01/08/23 1206 01/08/23 2026 01/09/23 0645  BP: 128/76 111/61 111/64 (!) 110/53  Pulse: 61 91 66 (!) 59  Resp: 17 18 18    Temp: 97.9 F (36.6 C) 98 F (36.7 C) 98 F (36.7 C) 98.1 F (36.7 C)  TempSrc: Axillary Oral Oral Oral  SpO2: 100% 100% 98% 100%  Weight:      Height:       General: alert, cooperative, and no distress Lochia: appropriate Uterine Fundus: firm Incision: Healing well with no significant drainage DVT Evaluation: No evidence of DVT seen on physical exam. Labs: Lab Results  Component Value Date   WBC 12.9 (H) 01/08/2023   HGB 10.7 (L)  01/08/2023   HCT 32.1 (L) 01/08/2023   MCV 89.4 01/08/2023   PLT 163 01/08/2023      Latest Ref Rng & Units 02/17/2016    7:00 PM  CMP  Glucose 65 - 99 mg/dL 147   BUN 6 - 20 mg/dL 11   Creatinine 8.29 - 1.00 mg/dL 5.62   Sodium 130 - 865 mmol/L 134   Potassium 3.5 - 5.1 mmol/L 3.8   Chloride 101 - 111 mmol/L 105   CO2 22 - 32 mmol/L 22   Calcium 8.9 - 10.3 mg/dL 8.7   Total Protein 6.5 - 8.1 g/dL 6.0   Total Bilirubin 0.3 - 1.2 mg/dL 0.6   Alkaline Phos 38 - 126 U/L 140   AST 15 - 41 U/L 18   ALT 14 - 54 U/L 17    Edinburgh Score:    01/08/2023   10:27 AM  Edinburgh Postnatal Depression Scale Screening Tool  I have been able to laugh and see the funny side of things. 0  I have looked forward with enjoyment to things. 0  I have blamed myself unnecessarily when things went wrong. 0  I have been anxious or worried for no good reason. 0  I have felt scared or panicky for no good reason. 0  Things have been  getting on top of me. 0  I have been so unhappy that I have had difficulty sleeping. 0  I have felt sad or miserable. 0  I have been so unhappy that I have been crying. 0  The thought of harming myself has occurred to me. 0  Edinburgh Postnatal Depression Scale Total 0      After visit meds:  Allergies as of 01/09/2023   No Known Allergies      Medication List     TAKE these medications    busPIRone 7.5 MG tablet Commonly known as: BUSPAR Take 7.5 mg by mouth daily.   cetirizine 10 MG tablet Commonly known as: ZYRTEC Take 10 mg by mouth daily.   MAG GLYCINATE PO Take 400-800 mg by mouth See admin instructions. Take 400 mg in the morning and 800 mg at night   multivitamin with minerals Tabs tablet Take 3 tablets by mouth daily.   oxyCODONE 5 MG immediate release tablet Commonly known as: Oxy IR/ROXICODONE Take 1 tablet (5 mg total) by mouth every 6 (six) hours as needed for severe pain.   pantoprazole 40 MG tablet Commonly known as: PROTONIX Take 40  mg by mouth daily.         Discharge home in stable condition Infant Feeding: Breast Infant Disposition:home with mother Discharge instruction: per After Visit Summary and Postpartum booklet. Activity: Advance as tolerated. Pelvic rest for 6 weeks.  Diet: routine diet Anticipated Birth Control: BTL done PP Postpartum Appointment:6 weeks Additional Postpartum F/U: Incision check 2 weeks Future Appointments:No future appointments. Follow up Visit:  Follow-up Information     Meisinger, Todd, MD Follow up in 2 week(s).   Specialty: Obstetrics and Gynecology Contact information: [address] [place] [address] [phone]                     01/09/2023 [facility] Lizabeth Leyden, MD

## 2023-01-09 NOTE — Progress Notes (Signed)
Patient is doing well.  She is tolerating PO, ambulating, voiding.  Pain is controlled.  Lochia is appropriate  Vitals:   01/08/23 0407 01/08/23 1206 01/08/23 2026 01/09/23 0645  BP: 128/76 111/61 111/64 (!) 110/53  Pulse: 61 91 66 (!) 59  Resp: 17 18 18    Temp: 97.9 F (36.6 C) 98 F (36.7 C) 98 F (36.7 C) 98.1 F (36.7 C)  TempSrc: Axillary Oral Oral Oral  SpO2: 100% 100% 98% 100%  Weight:      Height:        NAD Abdomen:  soft, appropriate tenderness, incisions intact.  Small amount of old blood stain on honeycomb ext:    Symmetric, trace edema bilaterally  Lab Results  Component Value Date   WBC 12.9 (H) 01/08/2023   HGB 10.7 (L) 01/08/2023   HCT 32.1 (L) 01/08/2023   MCV 89.4 01/08/2023   PLT 163 01/08/2023    --/--/O POS (04/30 1104)  A/P    36 y.o. [id number] POD #2 s/p RCS and bilateral salpingectomy Routine post op and postpartum care.   Meeting all goals.  Discharge to home today.

## 2023-01-11 LAB — SURGICAL PATHOLOGY

## 2023-01-19 ENCOUNTER — Telehealth (HOSPITAL_COMMUNITY): Payer: Self-pay | Admitting: *Deleted

## 2023-01-19 NOTE — Telephone Encounter (Signed)
Left phone voicemail message.  Duffy Rhody, RN 01-19-2023 at 11:18am

## 2023-05-04 ENCOUNTER — Encounter: Payer: Self-pay | Admitting: Gastroenterology

## 2023-07-22 ENCOUNTER — Ambulatory Visit: Payer: 59 | Admitting: Gastroenterology

## 2023-07-22 ENCOUNTER — Encounter: Payer: Self-pay | Admitting: Gastroenterology

## 2023-07-22 VITALS — BP 118/70 | HR 95 | Ht 65.0 in | Wt 218.0 lb

## 2023-07-22 DIAGNOSIS — K219 Gastro-esophageal reflux disease without esophagitis: Secondary | ICD-10-CM

## 2023-07-22 DIAGNOSIS — R1310 Dysphagia, unspecified: Secondary | ICD-10-CM

## 2023-07-22 NOTE — Progress Notes (Signed)
[facility] Consult Note:  History: Jennifer [name] 07/22/2023  Referring provider: None  Reason for consult/chief complaint: Gastroesophageal Reflux (Pt state she was born with acid reflux, pt has that burning sensation in the back of her throat that causes  her swallow, pt been having problems since childhood)   Subjective  Prior history:  Jennifer [name] was seen for reflux symptoms and constipation with rectal bleeding and a history of anal fissure at a GI practice at [facility] in November 2016, shortly after she had discovered that she was pregnant.  Therefore no endoscopic procedures were scheduled.  She was prescribed acid suppression therapy and lab test reportedly done for celiac (reports not available through this EHR). She is currently 6 months postpartum after C-section on 01/07/2023  Discussed the use of AI scribe software for clinical note transcription with the patient, who gave verbal consent to proceed.  History of Present Illness   The patient, with a lifelong history of gastroesophageal reflux disease (GERD), presents with persistent symptoms despite long-term use of pantoprazole. The patient describes the primary symptom as a burning sensation in the throat, not the chest, and experiences regurgitation, where vomit comes up to the back of the mouth but does not fully expel. These symptoms are present both during the day and at night, but they worsen at night, particularly when lying down and upon waking in the morning.  The patient has been on a high dose of pantoprazole 40 mg twice daily for the past five years, which provides significant relief from the symptoms. However, the patient recently attempted to discontinue the medication due to concerns about long-term use, which resulted in the recurrence of symptoms.  Occasionally, the patient experiences difficulty swallowing, where food feels like it gets stuck and takes time to go down. This is not a  constant issue but occurs sporadically. The patient denies any associated nausea, vomiting, or early satiety.  The patient has a family history of reflux issues, with both their mother and grandmother affected. The patient's mother had undergone an unspecified procedure for her reflux in the past. The patient denies any personal history of smoking, alcohol, or drug use.  The patient had a consultation with an outside gastroenterologist in 2016 during early pregnancy for similar issues. However, no procedures were performed at that time due to the pregnancy. The patient is now six months postpartum and not breastfeeding.       Denies abdominal pain.  Tends toward constipation.   ROS:  Review of Systems  Constitutional:  Negative for appetite change and unexpected weight change.  HENT:  Negative for mouth sores and voice change.   Eyes:  Negative for pain and redness.  Respiratory:  Negative for cough and shortness of breath.   Cardiovascular:  Negative for chest pain and palpitations.  Genitourinary:  Negative for dysuria and hematuria.  Musculoskeletal:  Negative for arthralgias and myalgias.  Skin:  Negative for pallor and rash.  Neurological:  Negative for weakness and headaches.  Hematological:  Negative for adenopathy.     Past Medical History: Past Medical History:  Diagnosis Date   Asthma    Allergy-induced   Bronchitis    Cholestasis    Fibromyalgia    IUGR (intrauterine growth restriction) 02/18/2016   S/P cesarean section 02/18/2016     Past Surgical History: Past Surgical History:  Procedure Laterality Date   CESAREAN SECTION N/A 02/17/2016   Procedure: CESAREAN SECTION;  Surgeon: Sherian Rein, MD;  [facility]  BIR[facility] SUIT[facility];  Service: Obstetrics;  Laterality: N/A;   CESAREAN SECTION N/A 01/16/2019   Procedure: CESAREAN SECTION;  Surgeon: Lavina Hamman, MD;  Location: [facility] LD ORS;  Service: Obstetrics;  Laterality: N/A;  [name],  RNFA   CESAREAN  SECTION WITH BILATERAL TUBAL LIGATION Bilateral 01/07/2023   Procedure: CESAREAN SECTION WITH BILATERAL TUBAL LIGATION;  Surgeon: Lavina Hamman, MD;  Location: [facility] LD ORS;  Service: Obstetrics;  Laterality: Bilateral;   KNEE SURGERY     WISDOM TOOTH EXTRACTION       Family History: Family History  Problem Relation Age of Onset   Diabetes Mother    Thyroid disease Mother    Diabetes Father    Thyroid disease Father    Diabetes Maternal Grandfather    Diabetes Paternal Grandmother    Diabetes Paternal Grandfather    Diabetes Other     Social History: Social History   Socioeconomic History   Marital status: Divorced    Spouse name: Not on file   Number of children: 3   Years of education: Not on file   Highest education level: Not on file  Occupational History   Occupation: Public relations account executive  Tobacco Use   Smoking status: Never   Smokeless tobacco: Never  Vaping Use   Vaping status: Never Used  Substance and Sexual Activity   Alcohol use: No   Drug use: No   Sexual activity: Yes    Birth control/protection: None  Other Topics Concern   Not on file  Social History Narrative   Not on file   Social Determinants of Health   Financial Resource Strain: Low Risk  (01/11/2019)   Overall Financial Resource Strain (CARDIA)    Difficulty of Paying Living Expenses: Not hard at all  Food Insecurity: No Food Insecurity (01/07/2023)   Hunger Vital Sign    Worried About Running Out of Food in the Last Year: Never true    Ran Out of Food in the Last Year: Never true  Transportation Needs: No Transportation Needs (01/07/2023)   PRAPARE - Administrator, Civil Service (Medical): No    Lack of Transportation (Non-Medical): No  Physical Activity: Not on file  Stress: Stress Concern Present (01/11/2019)   [employer] of Occupational Health - Occupational Stress Questionnaire    Feeling of Stress : To some extent  Social Connections: Unknown (01/20/2022)   Received  from [facility], [facility]   Social Network    Social Network: Not on file    Allergies: No Known Allergies  Outpatient Meds: Current Outpatient Medications  Medication Sig Dispense Refill   busPIRone (BUSPAR) 7.5 MG tablet Take 7.5 mg by mouth daily.     cetirizine (ZYRTEC) 10 MG tablet Take 10 mg by mouth daily.     Magnesium Bisglycinate (MAG GLYCINATE PO) Take 400-800 mg by mouth See admin instructions. Take 400 mg in the morning and 800 mg at night     Multiple Vitamin (MULTIVITAMIN WITH MINERALS) TABS tablet Take 3 tablets by mouth daily.     oxyCODONE (OXY IR/ROXICODONE) 5 MG immediate release tablet Take 1 tablet (5 mg total) by mouth every 6 (six) hours as needed for severe pain. 5 tablet 0   pantoprazole (PROTONIX) 40 MG tablet Take 40 mg by mouth daily.     No current facility-administered medications for this visit.      ___________________________________________________________________ Objective   Exam:  BP 118/70   Pulse 95   Ht 5\' 5"  (1.651 m)  Wt 218 lb (98.9 kg)   BMI 36.28 kg/m  Wt Readings from Last 3 Encounters:  07/22/23 218 lb (98.9 kg)  01/07/23 226 lb 8 oz (102.7 kg)  12/24/22 226 lb (102.5 kg)    General: Well-appearing, normal vocal quality Eyes: sclera anicteric, no redness ENT: oral mucosa moist without lesions, no cervical or supraclavicular lymphadenopathy CV: Regular without appreciable murmur, no JVD, no peripheral edema Resp: clear to auscultation bilaterally, normal RR and effort noted GI: soft, no tenderness, with active bowel sounds. No guarding or palpable organomegaly noted. Skin; warm and dry, no rash or jaundice noted Neuro: awake, alert and oriented x 3. Normal gross motor function and fluent speech    Encounter Diagnoses  Name Primary?   Gastroesophageal reflux disease, unspecified whether esophagitis present Yes   Dysphagia, unspecified type     Assessment and Plan    Gastroesophageal Reflux Disease  (GERD) Longstanding history of reflux symptoms, including heartburn and regurgitation, with some intermittent dysphagia. Symptoms are well controlled on Pantoprazole 80mg  daily, but patient has concerns about long-term use. Symptoms recur upon discontinuation of medication. -Schedule an upper endoscopy to evaluate esophagus and stomach, rule out eosinophilic esophagitis, and assess for any anatomical abnormalities. -Consider pH testing to quantify reflux if endoscopy is unremarkable. -Discuss potential for procedural interventions if reflux is confirmed and symptoms persist despite medication. We discussed the limitation of acid suppression therapy, breadth of diet and lifestyle changes required for reflux control, the possibility of reflux related complications such as esophagitis, stricture or Barrett's esophagus.  Other anatomic considerations such as gastric outlet obstruction or hiatal hernia may contribute to reflux.  Dysphagia Intermittent sensation of food getting stuck, not associated with weight loss or significant distress. -Evaluate during scheduled upper endoscopy.  Lifestyle modifications for GERD Patient reports symptoms worsen at night and upon waking, suggesting possible nocturnal reflux. -Recommend elevating head of bed or using a bed wedge to reduce nocturnal symptoms. -Continue to avoid eating within a few hours of bedtime.        Thank you for the courtesy of this consult.  Please call me with any questions or concerns.  Charlie Pitter III  CC: Referring provider noted above

## 2023-07-22 NOTE — Patient Instructions (Signed)
_______________________________________________________  If your blood pressure at your visit was 140/90 or greater, please contact your primary care physician to follow up on this.  _______________________________________________________  If you are age 35 or older, your body mass index should be between 23-30. Your Body mass index is 36.28 kg/m. If this is out of the aforementioned range listed, please consider follow up with your Primary Care Provider.  If you are age 42 or younger, your body mass index should be between 19-25. Your Body mass index is 36.28 kg/m. If this is out of the aformentioned range listed, please consider follow up with your Primary Care Provider.   ________________________________________________________  The [facility] providers would like to encourage you to use [facility] to communicate with providers for non-urgent requests or questions.  Due to long hold times on the telephone, sending your provider a message by [facility] may be a faster and more efficient way to get a response.  Please allow 48 business hours for a response.  Please remember that this is for non-urgent requests.  _______________________________________________________ It was a pleasure to see you today!  Thank you for trusting me with your gastrointestinal care!

## 2023-08-04 ENCOUNTER — Encounter: Payer: 59 | Admitting: Gastroenterology

## 2024-03-24 ENCOUNTER — Encounter: Payer: Self-pay | Admitting: Advanced Practice Midwife
